# Patient Record
Sex: Male | Born: 1955 | Race: White | Hispanic: No | Marital: Married | Smoking: Never smoker
Health system: Southern US, Community
[De-identification: ages and names within clinical notes are randomized; demographics above are authoritative.]

## PROBLEM LIST (undated history)

## (undated) DIAGNOSIS — B029 Zoster without complications: Secondary | ICD-10-CM

## (undated) DIAGNOSIS — IMO0002 Reserved for concepts with insufficient information to code with codable children: Secondary | ICD-10-CM

## (undated) DIAGNOSIS — I4891 Unspecified atrial fibrillation: Secondary | ICD-10-CM

## (undated) HISTORY — DX: Reserved for concepts with insufficient information to code with codable children: IMO0002

## (undated) HISTORY — PX: KNEE ARTHROSCOPY: SUR90

## (undated) HISTORY — DX: Unspecified atrial fibrillation: I48.91

## (undated) HISTORY — PX: FINGER FRACTURE SURGERY: SHX638

## (undated) HISTORY — DX: Zoster without complications: B02.9

## (undated) HISTORY — PX: TONSILLECTOMY: SUR1361

---

## 1998-12-22 ENCOUNTER — Emergency Department (HOSPITAL_COMMUNITY): Admission: EM | Admit: 1998-12-22 | Discharge: 1998-12-22 | Payer: Self-pay | Admitting: Emergency Medicine

## 2010-02-20 ENCOUNTER — Emergency Department (HOSPITAL_COMMUNITY): Admission: EM | Admit: 2010-02-20 | Discharge: 2010-02-20 | Payer: Self-pay | Admitting: Emergency Medicine

## 2010-10-05 LAB — POCT I-STAT, CHEM 8
BUN: 13 mg/dL (ref 6–23)
Calcium, Ion: 1.16 mmol/L (ref 1.12–1.32)
Creatinine, Ser: 1.2 mg/dL (ref 0.4–1.5)
HCT: 45 % (ref 39.0–52.0)
Hemoglobin: 15.3 g/dL (ref 13.0–17.0)
Potassium: 4 mEq/L (ref 3.5–5.1)
TCO2: 27 mmol/L (ref 0–100)

## 2010-10-05 LAB — POCT CARDIAC MARKERS: CKMB, poc: 5.4 ng/mL (ref 1.0–8.0)

## 2012-02-23 ENCOUNTER — Emergency Department (HOSPITAL_BASED_OUTPATIENT_CLINIC_OR_DEPARTMENT_OTHER): Payer: BC Managed Care – PPO

## 2012-02-23 ENCOUNTER — Other Ambulatory Visit: Payer: Self-pay

## 2012-02-23 ENCOUNTER — Encounter (HOSPITAL_BASED_OUTPATIENT_CLINIC_OR_DEPARTMENT_OTHER): Payer: Self-pay | Admitting: *Deleted

## 2012-02-23 ENCOUNTER — Emergency Department (HOSPITAL_BASED_OUTPATIENT_CLINIC_OR_DEPARTMENT_OTHER)
Admission: EM | Admit: 2012-02-23 | Discharge: 2012-02-23 | Disposition: A | Discharge status: Home / Self Care | Payer: BC Managed Care – PPO | Attending: Emergency Medicine | Admitting: Emergency Medicine

## 2012-02-23 DIAGNOSIS — E860 Dehydration: Secondary | ICD-10-CM

## 2012-02-23 DIAGNOSIS — R000 Tachycardia, unspecified: Secondary | ICD-10-CM | POA: Insufficient documentation

## 2012-02-23 DIAGNOSIS — I4891 Unspecified atrial fibrillation: Secondary | ICD-10-CM | POA: Insufficient documentation

## 2012-02-23 DIAGNOSIS — R42 Dizziness and giddiness: Secondary | ICD-10-CM | POA: Insufficient documentation

## 2012-02-23 DIAGNOSIS — I519 Heart disease, unspecified: Secondary | ICD-10-CM | POA: Insufficient documentation

## 2012-02-23 DIAGNOSIS — Y849 Medical procedure, unspecified as the cause of abnormal reaction of the patient, or of later complication, without mention of misadventure at the time of the procedure: Secondary | ICD-10-CM | POA: Insufficient documentation

## 2012-02-23 LAB — CARDIAC PANEL(CRET KIN+CKTOT+MB+TROPI)
CK, MB: 15.8 ng/mL (ref 0.3–4.0)
Total CK: 4799 U/L — ABNORMAL HIGH (ref 7–232)
Troponin I: <0.3 ng/mL (ref <0.30)

## 2012-02-23 LAB — COMPREHENSIVE METABOLIC PANEL
ALT: 66 U/L — ABNORMAL HIGH (ref 0–53)
Albumin: 4.3 g/dL (ref 3.5–5.2)
BUN: 25 mg/dL — ABNORMAL HIGH (ref 6–23)
Calcium: 9.4 mg/dL (ref 8.4–10.5)
GFR calc non Af Amer: 60 mL/min — ABNORMAL LOW (ref >90)
Glucose, Bld: 130 mg/dL — ABNORMAL HIGH (ref 70–99)
Potassium: 3.8 mEq/L (ref 3.5–5.1)
Sodium: 140 mEq/L (ref 135–145)
Total Bilirubin: 0.4 mg/dL (ref 0.3–1.2)

## 2012-02-23 LAB — CBC WITH DIFFERENTIAL/PLATELET
Basophils Relative: 1 % (ref 0–1)
Lymphocytes Relative: 33 % (ref 12–46)
Lymphs Abs: 2.4 10*3/uL (ref 0.7–4.0)
Monocytes Relative: 8 % (ref 3–12)
Neutro Abs: 4 10*3/uL (ref 1.7–7.7)
Platelets: 203 10*3/uL (ref 150–400)

## 2012-02-23 LAB — PHOSPHORUS: Phosphorus: 3.4 mg/dL (ref 2.3–4.6)

## 2012-02-23 LAB — MAGNESIUM: Magnesium: 2.2 mg/dL (ref 1.5–2.5)

## 2012-02-23 MED ORDER — DILTIAZEM HCL 25 MG/5ML IV SOLN
INTRAVENOUS | Status: AC
  Administered 2012-02-23: 10 mg via INTRAVENOUS
  Filled 2012-02-23: qty 5

## 2012-02-23 MED ORDER — SODIUM CHLORIDE 0.9 % IV BOLUS (SEPSIS)
1000.0000 mL | Freq: Once | INTRAVENOUS | Status: AC
  Administered 2012-02-23: 1000 mL via INTRAVENOUS

## 2012-02-23 MED ORDER — METOPROLOL SUCCINATE ER 25 MG PO TB24: 25.0000 mg | ORAL_TABLET | Freq: Every day | ORAL | Status: DC

## 2012-02-23 MED ORDER — DILTIAZEM HCL 50 MG/10ML IV SOLN
10.0000 mg | Freq: Once | INTRAVENOUS | Status: AC
  Administered 2012-02-23: 10 mg via INTRAVENOUS
  Filled 2012-02-23: qty 2

## 2012-02-23 MED ORDER — METOPROLOL TARTRATE 50 MG PO TABS
ORAL_TABLET | ORAL | Status: AC
  Filled 2012-02-23: qty 1

## 2012-02-23 MED ORDER — METOPROLOL SUCCINATE ER 25 MG PO TB24
25.0000 mg | ORAL_TABLET | Freq: Every day | ORAL | Status: DC
  Administered 2012-02-23: 25 mg via ORAL
  Filled 2012-02-23: qty 1

## 2012-02-23 MED ORDER — METOPROLOL TARTRATE 1 MG/ML IV SOLN
2.5000 mg | Freq: Once | INTRAVENOUS | Status: DC
  Filled 2012-02-23: qty 5

## 2012-02-23 MED ORDER — PROPOFOL 10 MG/ML IV BOLUS
0.5000 mg/kg | Freq: Once | INTRAVENOUS | Status: AC
  Administered 2012-02-23: 80 mg via INTRAVENOUS
  Filled 2012-02-23 (×2): qty 20

## 2012-02-23 MED ORDER — PROPOFOL 10 MG/ML IV BOLUS
INTRAVENOUS | Status: AC | PRN
  Administered 2012-02-23: 10 mg via INTRAVENOUS
  Administered 2012-02-23: 50 mg via INTRAVENOUS

## 2012-02-23 NOTE — ED Notes (Signed)
Family updated as to patient's status. Wife at bedside 

## 2012-02-23 NOTE — ED Provider Notes (Addendum)
History     CSN: [id number]  Arrival date & time 02/23/12  0211   First MD Initiated Contact with Patient 02/23/12 0221      Chief Complaint  Patient presents with  . Palpitations    (Consider location/radiation/quality/duration/timing/severity/associated sxs/prior treatment) Patient is a 56 y.o. male presenting with palpitations. The history is provided by the patient.  Palpitations  This is a new problem. The current episode started 1 to 2 hours ago. The problem occurs constantly. The problem has not changed since onset.Associated with: states always under a lot of stress but o/w was just eating icecream when it started. On average, each episode lasts 1 hour. Associated symptoms include irregular heartbeat and dizziness. Pertinent negatives include no diaphoresis, no fever, no chest pain, no chest pressure, no exertional chest pressure, no near-syncope, no cough and no shortness of breath. Associated symptoms comments: Lightheaded with standing. He has tried nothing for the symptoms. The treatment provided no relief. Risk factors include no known risk factors. His past medical history does not include anemia, heart disease or hyperthyroidism.    No past medical history on file.  No past surgical history on file.  No family history on file.  History  Substance Use Topics  . Smoking status: Not on file  . Smokeless tobacco: Not on file  . Alcohol Use: Not on file      Review of Systems  Constitutional: Negative for fever and diaphoresis.  Respiratory: Negative for cough and shortness of breath.   Cardiovascular: Positive for palpitations. Negative for chest pain and near-syncope.  Neurological: Positive for dizziness.  All other systems reviewed and are negative.    Allergies  Review of patient's allergies indicates not on file.  Home Medications  No current outpatient prescriptions on file.  BP 119/92  Pulse 140  Temp 97.6 F (36.4 C) (Oral)  Resp 20  Ht 5\' 10"   (1.778 m)  Wt 230 lb (104.327 kg)  BMI 33.00 kg/m2  SpO2 98%  Physical Exam  Nursing note and vitals reviewed. Constitutional: He is oriented to person, place, and time. He appears well-developed and well-nourished. No distress.  HENT:  Head: Normocephalic and atraumatic.  Mouth/Throat: Oropharynx is clear and moist.  Eyes: Conjunctivae and EOM are normal. Pupils are equal, round, and reactive to light.  Neck: Normal range of motion. Neck supple.  Cardiovascular: Intact distal pulses.  An irregularly irregular rhythm present. Tachycardia present.   No murmur heard. Pulmonary/Chest: Effort normal and breath sounds normal. No respiratory distress. He has no wheezes. He has no rales.  Abdominal: Soft. He exhibits no distension. There is no tenderness. There is no rebound and no guarding.  Musculoskeletal: Normal range of motion. He exhibits no edema and no tenderness.  Neurological: He is alert and oriented to person, place, and time.  Skin: Skin is warm and dry. No rash noted. No erythema.  Psychiatric: He has a normal mood and affect. His behavior is normal.    ED Course  CARDIOVERSION Date/Time: 02/23/2012 5:11 AM Performed by: Gwyneth Sprout Authorized by: Gwyneth Sprout Consent: Verbal consent obtained. Written consent obtained. Risks and benefits: risks, benefits and alternatives were discussed Consent given by: patient Patient understanding: patient states understanding of the procedure being performed Patient consent: the patient's understanding of the procedure matches consent given Relevant documents: relevant documents present and verified Patient identity confirmed: verbally with patient, arm band and hospital-assigned identification number Patient sedated: yes Sedation type: moderate (conscious) sedation Sedatives: propofol Vitals: Vital signs were  monitored during sedation. Cardioversion basis: elective Indications: failure of anti-arrhythmic  medications Pre-procedure rhythm: atrial fibrillation Patient position: patient was placed in a supine position Chest area: chest area exposed Electrodes: pads Electrodes placed: anterior-posterior Number of attempts: 2 Attempt 1 mode: synchronous Attempt 1 waveform: biphasic Attempt 1 shock (in Joules): 150 Attempt 1 outcome: no change in rhythm Attempt 2 mode: synchronous Attempt 2 waveform: biphasic Attempt 2 shock (in Joules): 200 Attempt 2 outcome: no change in rhythm Post-procedure rhythm: atrial fibrillation Complications: no complications Patient tolerance: Patient tolerated the procedure well with no immediate complications. Comments: Unable to shock out of a.fib   (including critical care time)  Labs Reviewed  COMPREHENSIVE METABOLIC PANEL - Abnormal; Notable for the following:    Glucose, Bld 130 (*)     BUN 25 (*)     AST 94 (*)     ALT 66 (*)     GFR calc non Af Amer 60 (*)     GFR calc Af Amer 70 (*)     All other components within normal limits  CARDIAC PANEL(CRET KIN+CKTOT+MB+TROPI) - Abnormal; Notable for the following:    Total CK 4799 (*)     CK, MB 15.8 (*)     All other components within normal limits  CBC WITH DIFFERENTIAL  MAGNESIUM  PHOSPHORUS   Dg Chest 2 View  02/23/2012  *RADIOLOGY REPORT*  Clinical Data: Lightheaded and atrial fibrillation.  CHEST - 2 VIEW  Comparison: 10/01/2011  Findings: Borderline heart size with normal pulmonary vascularity. No focal airspace consolidation in the lungs.  No blunting of costophrenic angles.  No pneumothorax.  Tortuous aorta. Degenerative changes in the spine.  No significant change since previous study.  IMPRESSION: No evidence of active pulmonary disease.  Original Report Authenticated By: Marlon Pel, M.D.     Date: 02/23/2012  Rate: 116  Rhythm: atrial fibrillation with RVR  QRS Axis: left  Intervals: normal  ST/T Wave abnormalities: normal  Conduction Disutrbances:none  Narrative  Interpretation:   Old EKG Reviewed: none available  CRITICAL CARE Performed by: Gwyneth Sprout   Total critical care time: 30  Critical care time was exclusive of separately billable procedures and treating other patients.  Critical care was necessary to treat or prevent imminent or life-threatening deterioration.  Critical care was time spent personally by me on the following activities: development of treatment plan with patient and/or surrogate as well as nursing, discussions with consultants, evaluation of patient's response to treatment, examination of patient, obtaining history from patient or surrogate, ordering and performing treatments and interventions, ordering and review of laboratory studies, ordering and review of radiographic studies, pulse oximetry and re-evaluation of patient's condition.   1. Atrial fibrillation   2. Dehydration       MDM   Pt with new onset a.fib starting tonight at 1am (1.5 hours ago)  Denies CP but states lightheaded with walking but no SOB.  No cardiac hx and neg stress last year.  No medical problems but states has lost 10lbs in the last 3 days from a no-carb diet.  Ate carbs today.  Pt in no distress and rate improved from 140 to 100's but still in a.fib.  Will give a dose of cardizem and will check CBC, CMP, and CE.  CXR pending.  Discussed with pt cardioversion.  3:51 AM Labs wnl other than elevated CK of 5,000 and CK-MB.  Neg troponin and normal electrolytes.  Pt's CK elevation is most likely due to his recent weight  loss and diet.  pt given a second liter of fluid.    IV cardizem controlled heart rate but did not return to sinus.  4:09 AM Pt consented for cardioversion.  Will sedate with propofol.    5:11 AM Despite 2 attempts 1 at 150J and second at 200J pt remained in a.fib.  Will discuss with cards. 5:45 AM Cards feels pt needs admission to hospitalist for further work up of CK of 5000 and they will consult.  Also they recommended  lopressor.  Gwyneth Sprout, MD  Pt spontaneosly converted to SR.  Repeat EKG wnl.  Spoke with Dr. Mayford Knife and recommended pt start on toprol 25 and f/u tomorrow.  Pt is ambulating without difficulty and otherwise no complaints.   Date: 02/23/2012  Rate: 70  Rhythm: normal sinus rhythm  QRS Axis: normal  Intervals: normal  ST/T Wave abnormalities: normal  Conduction Disutrbances: none  Narrative Interpretation: unremarkable     02/23/12 0513  Gwyneth Sprout, MD 02/23/12 1610  Gwyneth Sprout, MD 02/23/12 9604  Gwyneth Sprout, MD 02/23/12 [phone]

## 2012-02-23 NOTE — ED Notes (Signed)
MD at bedside.to discuss follow up care.  Pt OK for discharge home

## 2012-02-23 NOTE — ED Notes (Signed)
Pt presents to ED today "heart racing" and feeling lightheaded.  Pt has no signif hx.

## 2012-02-23 NOTE — ED Notes (Signed)
I placed a call for consult to the cardiologist on call per Dr. Anitra Lauth, Dr. Mayford Knife returned the call.

## 2012-02-24 ENCOUNTER — Encounter: Payer: Self-pay | Admitting: Cardiology

## 2012-02-24 ENCOUNTER — Ambulatory Visit (INDEPENDENT_AMBULATORY_CARE_PROVIDER_SITE_OTHER): Payer: BC Managed Care – PPO | Admitting: Cardiology

## 2012-02-24 ENCOUNTER — Telehealth: Payer: Self-pay | Admitting: Cardiology

## 2012-02-24 VITALS — BP 128/88 | HR 76 | Ht 71.0 in | Wt 244.0 lb

## 2012-02-24 DIAGNOSIS — I4891 Unspecified atrial fibrillation: Secondary | ICD-10-CM

## 2012-02-24 DIAGNOSIS — IMO0002 Reserved for concepts with insufficient information to code with codable children: Secondary | ICD-10-CM

## 2012-02-24 DIAGNOSIS — I4819 Other persistent atrial fibrillation: Secondary | ICD-10-CM | POA: Insufficient documentation

## 2012-02-24 DIAGNOSIS — I480 Paroxysmal atrial fibrillation: Secondary | ICD-10-CM

## 2012-02-24 DIAGNOSIS — R799 Abnormal finding of blood chemistry, unspecified: Secondary | ICD-10-CM

## 2012-02-24 HISTORY — DX: Reserved for concepts with insufficient information to code with codable children: IMO0002

## 2012-02-24 LAB — CK TOTAL AND CKMB (NOT AT ARMC)
CK, MB: 12.3 ng/mL — ABNORMAL HIGH (ref 0.3–4.0)
Relative Index: 0.9 (ref 0.0–2.5)

## 2012-02-24 NOTE — Telephone Encounter (Signed)
Scheduled appointment to be seen today.

## 2012-02-24 NOTE — Telephone Encounter (Signed)
Left message to call back  

## 2012-02-24 NOTE — Telephone Encounter (Signed)
F/U     Wife calling back to check on the status to see if her husband will be seen today or not

## 2012-02-24 NOTE — Progress Notes (Signed)
Cesar Wheeler Date of Birth:  09-08-1955 [facility] [address] [place], [place]  [address] [phone]         Fax   [phone]  History of Present Illness: This pleasant 56 year old gentleman is seen for the first time today.  He is a medical patient of Dr. Selena Batten.  He was in good health until this past weekend.  He became dehydrated wall doing yard work on Saturday.  He had also been on a crash weight loss program which involved not drinking water and avoiding all carbohydrates.  At about 1 AM on Sunday morning August 1 will eating some ice cream he felt sudden fluttering of his heart associated with a sensation of indigestion and palpitations.  He called his wife who took him to [facility] where he was found to be in atrial fibrillation with a rapid ventricular response.  Initially he did not respond to intravenous diltiazem and so he was cardioverted by the emergency room staff twice unsuccessfully.  He remained in atrial fibrillation and preparations were being made to transfer him to: When at about 6:30 in the morning he converted to normal sinus rhythm and the transfer was canceled.  Of note on that emergency room visit was a very high total CK of 4700 with slightly elevated CK-MB but normal troponins.  No thyroid tests were done.  His chemistries showed elevated liver function studies and evidence of mild dehydration with slightly elevated BUN and creatinine.  He was given a prescription for Toprol-XL 25 mg one daily and took it yesterday before leaving the emergency room but has not taken any today.  He has also been on a baby aspirin daily.  The patient does not drink any alcohol but does consume large quantities of diet drinks containing caffeine. Of note is that the patient does not have any history of ischemic heart disease.  He states that he had a stress test at [facility] about a year ago which was normal. Current Outpatient Prescriptions    Medication Sig Dispense Refill  . aspirin 81 MG tablet Take 81 mg by mouth daily.      . Glucosamine-Chondroitin (GLUCOSAMINE CHONDR COMPLEX PO) Take by mouth.      . metoprolol succinate (TOPROL-XL) 25 MG 24 hr tablet Take 1 tablet (25 mg total) by mouth daily.  10 tablet  0  . Multiple Vitamin (MULTIVITAMIN) capsule Take 1 capsule by mouth daily.        Allergies  Allergen Reactions  . Penicillins     Patient Active Problem List  Diagnosis  . PAF (paroxysmal atrial fibrillation)  . Abnormal blood test    History  Smoking status  . Never Smoker   Smokeless tobacco  . Not on file    History  Alcohol Use: Not on file    No family history on file.  Review of Systems: Constitutional: no fever chills diaphoresis or fatigue or change in weight.  Head and neck: no hearing loss, no epistaxis, no photophobia or visual disturbance. Respiratory: No cough, shortness of breath or wheezing. Cardiovascular: No chest pain peripheral edema, palpitations. Gastrointestinal: No abdominal distention, no abdominal pain, no change in bowel habits hematochezia or melena. Genitourinary: No dysuria, no frequency, no urgency, no nocturia. Musculoskeletal:No arthralgias, no back pain, no gait disturbance or myalgias. Neurological: No dizziness, no headaches, no numbness, no seizures, no syncope, no weakness, no tremors. Hematologic: No lymphadenopathy, no easy bruising. Psychiatric: No confusion, no hallucinations, no  sleep disturbance.    Physical Exam: Filed Vitals:   02/24/12 1159  BP: 128/88  Pulse: 76   the general appearance reveals a well-developed well-nourished gentleman in no distress.Pupils equal and reactive.   Extraocular Movements are full.  There is no scleral icterus.  The mouth and pharynx are normal.  The neck is supple.  The carotids reveal no bruits.  The jugular venous pressure is normal.  The thyroid is not enlarged.  There is no lymphadenopathy.  The chest is clear  to percussion and auscultation. There are no rales or rhonchi. Expansion of the chest is symmetrical.  The precordium is quiet.  The first heart sound is normal.  The second heart sound is physiologically split.  There is no murmur gallop rub or click.  There is no abnormal lift or heave.  The abdomen is soft and nontender. Bowel sounds are normal. The liver and spleen are not enlarged. There Are no abdominal masses. There are no bruits.  The pedal pulses are good.  There is no phlebitis or edema.  There is no cyanosis or clubbing. Strength is normal and symmetrical in all extremities.  There is no lateralizing weakness.  There are no sensory deficits.  The skin is warm and dry.  There is no rash.  EKG today shows normal sinus rhythm and is within normal limits.   Assessment / Plan: 1.  Paroxysmal atrial fibrillation, resolved 2.  mild exogenous obesity 3.  recent marked elevation of total CK levels probably secondary to dehydration and mild rhabdomyolysis.  Plan: At this point the patient is asymptomatic and is back in normal sinus rhythm.  We will have him continue his Toprol XL 25 one daily and continue his baby aspirin 81 mg daily.  He has a chadds score of 0. We will have him return for a two-dimensional echocardiogram.  Today we are checking thyroid function as well as repeating his CK and CK-MB and repeating his electrolytes and his liver function studies.  He has been advised to avoid caffeine.  He does not drink alcohol. We will plan to have him return in about one month for followup office visit and EKG  And we will call him with the results of his blood work and his echocardiogram results.  The patient was encouraged to get regular moderate aerobic exercise and to lose weight.

## 2012-02-24 NOTE — Patient Instructions (Addendum)
Your physician has requested that you have an echocardiogram. Echocardiography is a painless test that uses sound waves to create images of your heart. It provides your doctor with information about the size and shape of your heart and how well your heart's chambers and valves are working. This procedure takes approximately one hour. There are no restrictions for this procedure.  AVOID CAFFEINE   Start Toprol XL that was given  Will obtain labs today and call you with the results   Your physician recommends that you schedule a follow-up appointment in: 1 month ov/ekg with Lawson Fiscal NP or  Dr. Patty Sermons

## 2012-02-24 NOTE — Telephone Encounter (Signed)
New msg Pt was seen yesterday morning for afib and was told to see Dr Patty Sermons today

## 2012-02-25 ENCOUNTER — Ambulatory Visit (HOSPITAL_COMMUNITY): Payer: BC Managed Care – PPO | Attending: Cardiology

## 2012-02-25 DIAGNOSIS — I059 Rheumatic mitral valve disease, unspecified: Secondary | ICD-10-CM | POA: Insufficient documentation

## 2012-02-25 DIAGNOSIS — I480 Paroxysmal atrial fibrillation: Secondary | ICD-10-CM

## 2012-02-25 DIAGNOSIS — R002 Palpitations: Secondary | ICD-10-CM | POA: Insufficient documentation

## 2012-02-25 DIAGNOSIS — E669 Obesity, unspecified: Secondary | ICD-10-CM | POA: Insufficient documentation

## 2012-02-25 DIAGNOSIS — I4891 Unspecified atrial fibrillation: Secondary | ICD-10-CM

## 2012-02-25 LAB — HEPATIC FUNCTION PANEL
Alkaline Phosphatase: 59 U/L (ref 39–117)
Bilirubin, Direct: 0.1 mg/dL (ref 0.0–0.3)
Total Bilirubin: 0.6 mg/dL (ref 0.3–1.2)

## 2012-02-25 LAB — BASIC METABOLIC PANEL
BUN: 13 mg/dL (ref 6–23)
CO2: 24 mEq/L (ref 19–32)
Chloride: 106 mEq/L (ref 96–112)
Creatinine, Ser: 1.1 mg/dL (ref 0.4–1.5)
Potassium: 4.1 mEq/L (ref 3.5–5.1)

## 2012-02-25 NOTE — Progress Notes (Signed)
Echocardiogram performed.  

## 2012-02-26 ENCOUNTER — Other Ambulatory Visit: Payer: Self-pay | Admitting: *Deleted

## 2012-02-26 ENCOUNTER — Other Ambulatory Visit (INDEPENDENT_AMBULATORY_CARE_PROVIDER_SITE_OTHER): Payer: BC Managed Care – PPO

## 2012-02-26 ENCOUNTER — Telehealth: Payer: Self-pay | Admitting: Cardiology

## 2012-02-26 DIAGNOSIS — R799 Abnormal finding of blood chemistry, unspecified: Secondary | ICD-10-CM

## 2012-02-26 DIAGNOSIS — IMO0002 Reserved for concepts with insufficient information to code with codable children: Secondary | ICD-10-CM

## 2012-02-26 DIAGNOSIS — I4891 Unspecified atrial fibrillation: Secondary | ICD-10-CM

## 2012-02-26 NOTE — Telephone Encounter (Signed)
Pt rtn your call

## 2012-02-26 NOTE — Telephone Encounter (Signed)
Advised of echo.  Patient coming for labs today and is to disregard labs in system on 02/24/12

## 2012-02-26 NOTE — Telephone Encounter (Signed)
Message copied by Burnell Blanks on Wed Feb 26, 2012  4:23 PM ------      Message from: Cassell Clement      Created: Tue Feb 25, 2012  9:27 PM       Echo is normal please report

## 2012-02-27 ENCOUNTER — Telehealth: Payer: Self-pay | Admitting: Cardiology

## 2012-02-27 LAB — HEPATIC FUNCTION PANEL
AST: 36 U/L (ref 0–37)
Alkaline Phosphatase: 58 U/L (ref 39–117)
Total Bilirubin: 0.9 mg/dL (ref 0.3–1.2)

## 2012-02-27 LAB — T4, FREE: Free T4: 0.5 ng/dL — ABNORMAL LOW (ref 0.60–1.60)

## 2012-02-27 LAB — CARDIAC PANEL
CK-MB: 13 ng/mL — ABNORMAL HIGH (ref 0.3–4.0)
Relative Index: 2.3 calc (ref 0.0–2.5)

## 2012-02-27 LAB — BASIC METABOLIC PANEL
Calcium: 9.4 mg/dL (ref 8.4–10.5)
GFR: 77.03 mL/min (ref >60.00)
Potassium: 4 mEq/L (ref 3.5–5.1)
Sodium: 138 mEq/L (ref 135–145)

## 2012-02-27 LAB — TSH: TSH: 7.49 u[IU]/mL — ABNORMAL HIGH (ref 0.35–5.50)

## 2012-02-27 NOTE — Telephone Encounter (Signed)
New msg Pt wants to know lab results from labs that were redone.

## 2012-02-27 NOTE — Telephone Encounter (Signed)
Advised patient  Dr. Patty Sermons had not reviewed labs, will call after reviewed

## 2012-02-28 ENCOUNTER — Telehealth: Payer: Self-pay | Admitting: *Deleted

## 2012-02-28 MED ORDER — METOPROLOL SUCCINATE ER 25 MG PO TB24: 25.0000 mg | ORAL_TABLET | Freq: Every day | ORAL | Status: DC

## 2012-02-28 NOTE — Telephone Encounter (Signed)
Advised of labs 

## 2012-02-28 NOTE — Telephone Encounter (Signed)
Message copied by Burnell Blanks on Fri Feb 28, 2012  4:31 PM ------      Message from: Cassell Clement      Created: Thu Feb 27, 2012  8:53 PM       Repeat labs show further improvement in CK level. The thyroid tests now show slight hypothyroid status.I would like him to see his PCP Dr. Selena Batten about treatment of his thyroid.

## 2012-03-24 ENCOUNTER — Ambulatory Visit (INDEPENDENT_AMBULATORY_CARE_PROVIDER_SITE_OTHER): Payer: BC Managed Care – PPO | Admitting: Cardiology

## 2012-03-24 ENCOUNTER — Encounter: Payer: Self-pay | Admitting: Cardiology

## 2012-03-24 VITALS — BP 113/76 | HR 72 | Ht 71.0 in | Wt 238.8 lb

## 2012-03-24 DIAGNOSIS — I4891 Unspecified atrial fibrillation: Secondary | ICD-10-CM

## 2012-03-24 DIAGNOSIS — I480 Paroxysmal atrial fibrillation: Secondary | ICD-10-CM

## 2012-03-24 DIAGNOSIS — IMO0002 Reserved for concepts with insufficient information to code with codable children: Secondary | ICD-10-CM

## 2012-03-24 DIAGNOSIS — R799 Abnormal finding of blood chemistry, unspecified: Secondary | ICD-10-CM

## 2012-03-24 NOTE — Assessment & Plan Note (Signed)
The patient was recently found to have a slightly elevated TSH level and his primary care physician has put him on a low dose of Synthroid 50 mcg daily.

## 2012-03-24 NOTE — Progress Notes (Signed)
Cesar Wheeler Date of Birth:  [birth year] [facility] [address] [place], [place]  [address] [phone]  Fax   [phone]  HPI: This pleasant 56 year old gentleman is seen for a one-month followup office visit. He is a medical patient of Dr. Selena Batten. He was in good health until about one month ago. He became dehydrated wall doing yard work on Saturday. He had also been on a crash weight loss program which involved not drinking water and avoiding all carbohydrates. At about 1 AM on Sunday morning August 1 he was eating some ice cream he felt sudden fluttering of his heart associated with a sensation of indigestion and palpitations. He called his wife who took him to [facility] where he was found to be in atrial fibrillation with a rapid ventricular response. Initially he did not respond to intravenous diltiazem and so he was cardioverted by the emergency room staff twice unsuccessfully. He remained in atrial fibrillation and preparations were being made to transfer him to: When at about 6:30 in the morning he converted to normal sinus rhythm and the transfer was canceled. Of note on that emergency room visit was a very high total CK of 4700 with slightly elevated CK-MB but normal troponins. No thyroid tests were done. His chemistries showed elevated liver function studies and evidence of mild dehydration with slightly elevated BUN and creatinine. He was given a prescription for Toprol-XL 25 mg one daily. He has also been on a baby aspirin daily. The patient does not drink any alcohol but previously consumed large quantities of diet drinks containing caffeine.  Of note is that the patient does not have any history of ischemic heart disease. He states that he had a stress test at [facility] about a year ago which was normal.   Current Outpatient Prescriptions  Medication Sig Dispense Refill  . aspirin 81 MG tablet Take 81 mg by mouth daily.      .  Glucosamine-Chondroitin (GLUCOSAMINE CHONDR COMPLEX PO) Take by mouth.      . levothyroxine (SYNTHROID, LEVOTHROID) 50 MCG tablet Take 50 mcg by mouth daily.      . metoprolol succinate (TOPROL-XL) 25 MG 24 hr tablet Take 25 mg by mouth as directed. 1/2 tablet daily      . Multiple Vitamin (MULTIVITAMIN) capsule Take 1 capsule by mouth daily.      Marland Kitchen DISCONTD: metoprolol succinate (TOPROL-XL) 25 MG 24 hr tablet Take 1 tablet (25 mg total) by mouth daily.  30 tablet  3    Allergies  Allergen Reactions  . Penicillins     Patient Active Problem List  Diagnosis  . PAF (paroxysmal atrial fibrillation)  . Abnormal blood test    History  Smoking status  . Never Smoker   Smokeless tobacco  . Not on file    History  Alcohol Use: Not on file    No family history on file.  Review of Systems: The patient denies any heat or cold intolerance.  No weight gain or weight loss.  The patient denies headaches or blurry vision.  There is no cough or sputum production.  The patient denies dizziness.  There is no hematuria or hematochezia.  The patient denies any muscle aches or arthritis.  The patient denies any rash.  The patient denies frequent falling or instability.  There is no history of depression or anxiety.  All other systems were reviewed and are negative.   Physical Exam: Filed Vitals:   03/24/12  1537  BP: 113/76  Pulse: 72   the general appearance reveals a well-developed well-nourished no distress.The head and neck exam reveals pupils equal and reactive.  Extraocular movements are full.  There is no scleral icterus.  The mouth and pharynx are normal.  The neck is supple.  The carotids reveal no bruits.  The jugular venous pressure is normal.  The  thyroid is not enlarged.  There is no lymphadenopathy.  The chest is clear to percussion and auscultation.  There are no rales or rhonchi.  Expansion of the chest is symmetrical.  The precordium is quiet.  The first heart sound is normal.  The  second heart sound is physiologically split.  There is no murmur gallop rub or click.  There is no abnormal lift or heave.  The abdomen is soft and nontender.  The bowel sounds are normal.  The liver and spleen are not enlarged.  There are no abdominal masses.  There are no abdominal bruits.  Extremities reveal good pedal pulses.  There is no phlebitis or edema.  There is no cyanosis or clubbing.  Strength is normal and symmetrical in all extremities.  There is no lateralizing weakness.  There are no sensory deficits.  The skin is warm and dry.  There is no rash.  EKG today shows sinus rhythm at 72 per minute and no ischemic changes    Assessment / Plan: We are going to decrease his Toprol-XL to just 12.5 mg daily.  We will plan to see him in another 4 months and repeat his EKG then.  He has no further problems with his atrial fib we will consider stopping his Toprol altogether at that point.  He feels that a lot of his previous episode of atrial fib was related to dehydration and excessive caffeine.

## 2012-03-24 NOTE — Assessment & Plan Note (Signed)
Since last visit the patient has had no recurrence of his paroxysmal atrial fibrillation.  He had an echocardiogram on 02/25/12 which showed an ejection fraction of 55-65% and was normal. Since last visit the patient has avoided caffeine.  Complains of lack of energy and attributes that to lack of caffeine and also the effects of Toprol

## 2012-03-24 NOTE — Patient Instructions (Addendum)
Decrease your Toprol 25 mg to 1/2 tablet daily  Your physician wants you to follow-up in: 4 months You will receive a reminder letter in the mail two months in advance. If you don't receive a letter, please call our office to schedule the follow-up appointment.

## 2012-04-20 ENCOUNTER — Other Ambulatory Visit: Payer: Self-pay | Admitting: Family Medicine

## 2012-04-20 DIAGNOSIS — E049 Nontoxic goiter, unspecified: Secondary | ICD-10-CM

## 2012-04-21 ENCOUNTER — Ambulatory Visit
Admission: RE | Admit: 2012-04-21 | Discharge: 2012-04-21 | Disposition: A | Discharge status: Home / Self Care | Payer: BC Managed Care – PPO | Source: Ambulatory Visit | Attending: Family Medicine | Admitting: Family Medicine

## 2012-04-21 DIAGNOSIS — E049 Nontoxic goiter, unspecified: Secondary | ICD-10-CM

## 2012-06-02 ENCOUNTER — Encounter: Payer: Self-pay | Admitting: Cardiology

## 2012-06-02 ENCOUNTER — Ambulatory Visit (INDEPENDENT_AMBULATORY_CARE_PROVIDER_SITE_OTHER): Payer: BC Managed Care – PPO | Admitting: Cardiology

## 2012-06-02 VITALS — BP 124/86 | HR 76 | Ht 71.0 in | Wt 237.0 lb

## 2012-06-02 DIAGNOSIS — I4891 Unspecified atrial fibrillation: Secondary | ICD-10-CM

## 2012-06-02 DIAGNOSIS — I480 Paroxysmal atrial fibrillation: Secondary | ICD-10-CM

## 2012-06-02 MED ORDER — DILTIAZEM HCL ER COATED BEADS 300 MG PO CP24: ORAL_CAPSULE | ORAL | Status: DC

## 2012-06-02 NOTE — Progress Notes (Signed)
Cesar Wheeler Date of Birth:  1956-04-12 [facility] [address] [place], [place]  [address] [phone]  Fax   [phone]  HPI: This pleasant 56 year old gentleman is seen as a work in the office visit.  He has a past history of paroxysmal atrial fibrillation.  He does not have any history of congestive heart failure, diabetes, high blood pressure, or previous stroke.  His Chadss score is 0.  He is on daily aspirin his last episode of atrial fibrillation was in August 2013. Of note is that the patient does not have any history of ischemic heart disease. He states that he had a stress test at [facility] about a year ago which was normal.   Current Outpatient Prescriptions  Medication Sig Dispense Refill  . aspirin 81 MG tablet Take 81 mg by mouth daily.      . Glucosamine-Chondroitin (GLUCOSAMINE CHONDR COMPLEX PO) Take by mouth.      . levothyroxine (SYNTHROID, LEVOTHROID) 25 MCG tablet Take 25 mcg by mouth daily.      . metoprolol succinate (TOPROL-XL) 25 MG 24 hr tablet Take by mouth. 1/2 tablet daily      . Multiple Vitamin (MULTIVITAMIN) capsule Take 1 capsule by mouth daily.      Marland Kitchen diltiazem (CARDIZEM CD) 300 MG 24 hr capsule Take 1 tablet as needed  4 capsule  5    Allergies  Allergen Reactions  . Penicillins     Patient Active Problem List  Diagnosis  . PAF (paroxysmal atrial fibrillation)  . Abnormal blood test    History  Smoking status  . Never Smoker   Smokeless tobacco  . Not on file    History  Alcohol Use: Not on file    No family history on file.  Review of Systems: The patient denies any heat or cold intolerance.  No weight gain or weight loss.  The patient denies headaches or blurry vision.  There is no cough or sputum production.  The patient denies dizziness.  There is no hematuria or hematochezia.  The patient denies any muscle aches or arthritis.  The patient denies any rash.  The patient denies frequent falling or  instability.  There is no history of depression or anxiety.  All other systems were reviewed and are negative.   Physical Exam: Filed Vitals:   06/02/12 1102  BP: 124/86  Pulse: 76   the general appearance reveals a well-developed well-nourished gentleman in no distress.The head and neck exam reveals pupils equal and reactive.  Extraocular movements are full.  There is no scleral icterus.  The mouth and pharynx are normal.  The neck is supple.  The carotids reveal no bruits.  The jugular venous pressure is normal.  The  thyroid is not enlarged.  There is no lymphadenopathy.  The chest is clear to percussion and auscultation.  There are no rales or rhonchi.  Expansion of the chest is symmetrical.  The precordium is quiet.  The first heart sound is normal.  The second heart sound is physiologically split.  There is no murmur gallop rub or click.  There is no abnormal lift or heave.  The abdomen is soft and nontender.  The bowel sounds are normal.  The liver and spleen are not enlarged.  There are no abdominal masses.  There are no abdominal bruits.  Extremities reveal good pedal pulses.  There is no phlebitis or edema.  There is no cyanosis or clubbing.  Strength is normal and symmetrical  in all extremities.  There is no lateralizing weakness.  There are no sensory deficits.  The skin is warm and dry.  There is no rash.  EKG shows normal sinus rhythm and is within normal limits.    Assessment / Plan: The patient is to continue taking a 81 mg aspirin daily and he should stay on Toprol-XL 25 mg daily.  We also gave him a prescription for Cardizem CD 300 mg to be used as a pill in the pocket. The patient is advised to continue to limit caffeine and to get regular aerobic exercise. Recheck here in 6 months for followup office visit and EKG

## 2012-06-02 NOTE — Assessment & Plan Note (Signed)
The patient had another episode of paroxysmal atrial fibrillation while driving to the beach last Friday night.  He had had a stressful cell phone conversation just prior to the onset of his arrhythmia.  Prior to that he had drunk a diet coke.  [place] he [place] the beach he went to the local emergency room where he was admitted for observation overnight.  He converted on IV Cardizem at about 5 AM and was discharged later that day.  He was advised by the hospital to increase his beta blocker up to a whole 25 mg tablet daily.  There also cut back his Synthroid dose from 50 mcg down to 25 mcg daily apparently as a result of blood work obtained

## 2012-06-02 NOTE — Patient Instructions (Signed)
Will Rx Cardizem CD 300 mg to take at onset of Afib, Rx sent to [facility]  Your physician wants you to follow-up in: 6 months ov/ekg You will receive a reminder letter in the mail two months in advance. If you don't receive a letter, please call our office to schedule the follow-up appointment.

## 2012-06-08 ENCOUNTER — Telehealth: Payer: Self-pay | Admitting: Cardiology

## 2012-06-08 NOTE — Telephone Encounter (Signed)
Pt had a-fib last night and he is concerned and he took the medication and he wants to make sure he doesn't need to come in

## 2012-06-08 NOTE — Telephone Encounter (Signed)
Patient called earlier with episode of Afib yesterday for about 7 hours.  Patient had drank a carbonated drink (sprite) and he felt this could have contributed to episode. Patient took his Cardizem but it was several hours before he converted. Discussed with  Dr. Patty Sermons and will have the patient increase his Metoprolol 25 mg to twice a day and cont with Cardizem as needed.  Advised patient if he continues to have episodes to call back.  Patient verbalized understanding.

## 2012-06-08 NOTE — Telephone Encounter (Signed)
Left pt a message to call back. 

## 2012-06-15 ENCOUNTER — Telehealth: Payer: Self-pay | Admitting: Cardiology

## 2012-06-15 NOTE — Telephone Encounter (Signed)
Pt rtn call [phone]

## 2012-06-15 NOTE — Telephone Encounter (Signed)
Left message to call back  

## 2012-06-15 NOTE — Telephone Encounter (Signed)
Another episode of Afib over weekend, will see for ov tomorrow

## 2012-06-15 NOTE — Telephone Encounter (Signed)
Pt calling re afib episodes, has colonoscopy consult tomorrow, should he cxl

## 2012-06-16 ENCOUNTER — Ambulatory Visit (INDEPENDENT_AMBULATORY_CARE_PROVIDER_SITE_OTHER): Payer: BC Managed Care – PPO | Admitting: Cardiology

## 2012-06-16 ENCOUNTER — Encounter: Payer: Self-pay | Admitting: Cardiology

## 2012-06-16 VITALS — BP 132/70 | HR 82 | Resp 18 | Ht 71.0 in | Wt 239.0 lb

## 2012-06-16 DIAGNOSIS — I4891 Unspecified atrial fibrillation: Secondary | ICD-10-CM

## 2012-06-16 DIAGNOSIS — I480 Paroxysmal atrial fibrillation: Secondary | ICD-10-CM

## 2012-06-16 MED ORDER — DILTIAZEM HCL ER COATED BEADS 300 MG PO CP24: 300.0000 mg | ORAL_CAPSULE | Freq: Every day | ORAL | Status: DC

## 2012-06-16 NOTE — Patient Instructions (Signed)
STOP METOPROLOL AND START CARDIZEM CD 300 MG DAILY, RX SENT TO [facility]  Your physician recommends that you schedule a follow-up appointment in: 2 month ov/ekg

## 2012-06-16 NOTE — Assessment & Plan Note (Signed)
The patient is having more frequent episodes of paroxysmal atrial fibrillation.  One of the episodes occurred during sleep and he awoke in atrial fibrillation last weekend.  Took Cardizem 300 mg and converted within 3 hours. In an effort to decrease the frequency of his paroxysmal atrial fibrillation we are going to put him on daily Cardizem 300 CD one daily.  If he breaks through with atrial fib and a rapid ventricular response he can take a second tablet of Cardizem.  We will stop his metoprolol at this point since it does not appear to have been effective in a dose of Toprol 25 mg twice a day.

## 2012-06-16 NOTE — Progress Notes (Signed)
Cesar Wheeler Date of Birth:  1956/02/04 [facility] [address] [place], [place]  [address] [phone]         Fax   [phone]  History of Present Illness: This pleasant 56 year old gentleman is seen for a followup office visit. He has a past history of paroxysmal atrial fibrillation. He does not have any history of congestive heart failure, diabetes, high blood pressure, or previous stroke. His Chadss score is 0. He is on daily aspirin.  His echocardiogram 02/25/12 showed an ejection fraction of 55-65% with mild mitral regurgitation and  his atrial size was at the upper limit of normal.  The patient had an episode of atrial fibrillation on 02/23/2012.  He has had subsequent episodes on November 8, November 17, and November 24.  He has been using Cardizem as a pill in the pocket and it usually works within a few hours.  Several of his episodes have occurred after drinking carbonated drinks, and these were drinks without caffeine. Of note is that the patient does not have any history of ischemic heart disease. He states that he had a stress test at [facility] about a year ago which was normal.   Current Outpatient Prescriptions  Medication Sig Dispense Refill  . aspirin 81 MG tablet Take 81 mg by mouth daily.      Marland Kitchen diltiazem (CARDIZEM CD) 300 MG 24 hr capsule Take 1 capsule (300 mg total) by mouth daily.  30 capsule  5  . Glucosamine-Chondroitin (GLUCOSAMINE CHONDR COMPLEX PO) Take by mouth.      . levothyroxine (SYNTHROID, LEVOTHROID) 25 MCG tablet Take 25 mcg by mouth 2 (two) times daily.       . Multiple Vitamin (MULTIVITAMIN) capsule Take 1 capsule by mouth daily.      . [DISCONTINUED] diltiazem (CARDIZEM CD) 300 MG 24 hr capsule Take 1 tablet as needed  4 capsule  5  . fluticasone (FLONASE) 50 MCG/ACT nasal spray         Allergies  Allergen Reactions  . Penicillins     Patient Active Problem List  Diagnosis  . PAF (paroxysmal atrial fibrillation)  .  Abnormal blood test    History  Smoking status  . Never Smoker   Smokeless tobacco  . Not on file    History  Alcohol Use: Not on file    No family history on file.  Review of Systems: Constitutional: no fever chills diaphoresis or fatigue or change in weight.  Head and neck: no hearing loss, no epistaxis, no photophobia or visual disturbance. Respiratory: No cough, shortness of breath or wheezing. Cardiovascular: No chest pain peripheral edema, palpitations. Gastrointestinal: No abdominal distention, no abdominal pain, no change in bowel habits hematochezia or melena. Genitourinary: No dysuria, no frequency, no urgency, no nocturia. Musculoskeletal:No arthralgias, no back pain, no gait disturbance or myalgias. Neurological: No dizziness, no headaches, no numbness, no seizures, no syncope, no weakness, no tremors. Hematologic: No lymphadenopathy, no easy bruising. Psychiatric: No confusion, no hallucinations, no sleep disturbance.    Physical Exam: Filed Vitals:   06/16/12 1412  BP: 132/70  Pulse: 82  Resp: 18   the general appearance reveals a well-developed well-nourished gentleman in no distress.The head and neck exam reveals pupils equal and reactive.  Extraocular movements are full.  There is no scleral icterus.  The mouth and pharynx are normal.  The neck is supple.  The carotids reveal no bruits.  The jugular venous pressure is normal.  The  thyroid  is not enlarged.  There is no lymphadenopathy.  The chest is clear to percussion and auscultation.  There are no rales or rhonchi.  Expansion of the chest is symmetrical.  The precordium is quiet.  The first heart sound is normal.  The second heart sound is physiologically split.  There is no murmur gallop rub or click.  There is no abnormal lift or heave.  The abdomen is soft and nontender.  The bowel sounds are normal.  The liver and spleen are not enlarged.  There are no abdominal masses.  There are no abdominal bruits.   Extremities reveal good pedal pulses.  There is no phlebitis or edema.  There is no cyanosis or clubbing.  Strength is normal and symmetrical in all extremities.  There is no lateralizing weakness.  There are no sensory deficits.  The skin is warm and dry.  There is no rash.  EKG shows normal sinus rhythm at 72 per minute and no ischemic changes.  He has left axis deviation.   Assessment / Plan: Stop metoprolol and to switch to Cardizem CD 300 one daily and observe response of frequency of his paroxysmal atrial fibrillation episodes.  Continue daily aspirin.  He is CHADSS score of 0 and it does not need Coumadin.

## 2012-06-29 ENCOUNTER — Telehealth: Payer: Self-pay | Admitting: Cardiology

## 2012-06-29 NOTE — Telephone Encounter (Signed)
New problem:   C/O Afib on Saturday am.

## 2012-06-29 NOTE — Telephone Encounter (Signed)
No need to see at this point.  Keep working on treatment for the acid reflux and avoid spicy foods etc. continue to use the extra pill in the pocket Cardizem when necessary.  Keep regular appointment unless symptoms worsen.

## 2012-06-29 NOTE — Telephone Encounter (Signed)
Had a lot of spicy food Friday night and woke up Saturday am with A Fib, took extra pill and in about 3 hours better.  Is having a problem with reflux and thinks it might be triggering it. Started taking Prilosec 20 mg 14 days ago and still had issue on Saturday am.  This was the first Afib in 2 weeks. Will forward to  Dr. Patty Sermons for Garald Balding

## 2012-06-29 NOTE — Telephone Encounter (Signed)
Advised patient

## 2012-07-07 ENCOUNTER — Encounter: Payer: Self-pay | Admitting: *Deleted

## 2012-07-07 ENCOUNTER — Telehealth: Payer: Self-pay | Admitting: Cardiology

## 2012-07-07 NOTE — Telephone Encounter (Signed)
Scheduled appointment for Thursday, A Fib every weekend now

## 2012-07-07 NOTE — Telephone Encounter (Signed)
plz return call to pt [phone] regarding more A-Fib symptoms

## 2012-07-07 NOTE — Telephone Encounter (Signed)
Does convert to NSR after about 3 hours

## 2012-07-09 ENCOUNTER — Ambulatory Visit (INDEPENDENT_AMBULATORY_CARE_PROVIDER_SITE_OTHER): Payer: BC Managed Care – PPO | Admitting: Cardiology

## 2012-07-09 ENCOUNTER — Encounter: Payer: Self-pay | Admitting: Cardiology

## 2012-07-09 VITALS — BP 102/70 | HR 65 | Ht 71.0 in | Wt 238.0 lb

## 2012-07-09 DIAGNOSIS — I4891 Unspecified atrial fibrillation: Secondary | ICD-10-CM

## 2012-07-09 DIAGNOSIS — K219 Gastro-esophageal reflux disease without esophagitis: Secondary | ICD-10-CM

## 2012-07-09 DIAGNOSIS — I480 Paroxysmal atrial fibrillation: Secondary | ICD-10-CM

## 2012-07-09 NOTE — Progress Notes (Signed)
Cesar Wheeler Date of Birth:  November 03, 1955 Cesar Wheeler [address] [place], [place]  [address] [phone]  Fax   [phone]  HPI: This pleasant 56 year old gentleman is seen for a work in office visit. He has a past history of paroxysmal atrial fibrillation. He does not have any history of congestive heart failure, diabetes, high blood pressure, or previous stroke. His Chadss score is 0. He is on daily aspirin. His echocardiogram 02/25/12 showed an ejection fraction of 55-65% with mild mitral regurgitation and his atrial size was at the upper limit of normal. The patient had an episode of atrial fibrillation on 02/23/2012. He has had subsequent episodes on November 8, November 17, and November 24.  Since we last saw him he has had 3 additional episodes, on December 7, December 15, and December 19.  He is on Cardizem CD 300 mg daily every day. He has been using additional Cardizem as a pill in the pocket and it usually works within a few hours. Several of his episodes have occurred after drinking carbonated drinks, and these were drinks without caffeine.  Of note is that the patient does not have any history of ischemic heart disease. He states that he had a stress test at [facility] about a year ago which was normal.  Of note is the fact that he had recent thyroid function tests after which she was told to stop his Synthroid immediately.  He has been off his Synthroid for a day and a half.   Current Outpatient Prescriptions  Medication Sig Dispense Refill  . aspirin 81 MG tablet Take 81 mg by mouth daily.      Marland Kitchen diltiazem (CARDIZEM CD) 300 MG 24 hr capsule Take 1 capsule (300 mg total) by mouth daily.  30 capsule  5  . fluticasone (FLONASE) 50 MCG/ACT nasal spray       . omeprazole (PRILOSEC OTC) 20 MG tablet Take 20 mg by mouth daily.        Allergies  Allergen Reactions  . Penicillins     Patient Active Problem List  Diagnosis  . PAF (paroxysmal atrial fibrillation)    . Abnormal blood test    History  Smoking status  . Never Smoker   Smokeless tobacco  . Not on file    History  Alcohol Use: Not on file    No family history on file.  Review of Systems: The patient denies any heat or cold intolerance.  No weight gain or weight loss.  The patient denies headaches or blurry vision.  There is no cough or sputum production.  The patient denies dizziness.  There is no hematuria or hematochezia.  The patient denies any muscle aches or arthritis.  The patient denies any rash.  The patient denies frequent falling or instability.  There is no history of depression or anxiety.  All other systems were reviewed and are negative.   Physical Exam: Filed Vitals:   07/09/12 1427  BP: 102/70  Pulse: 65   general appearance reveals a large gentleman in no distress.  Rhythm is regular today.The head and neck exam reveals pupils equal and reactive.  Extraocular movements are full.  There is no scleral icterus.  The mouth and pharynx are normal.  The neck is supple.  The carotids reveal no bruits.  The jugular venous pressure is normal.  The  thyroid is not enlarged.  There is no lymphadenopathy.  The chest is clear to percussion and auscultation.  There are  no rales or rhonchi.  Expansion of the chest is symmetrical.  The precordium is quiet.  The first heart sound is normal.  The second heart sound is physiologically split.  There is no murmur gallop rub or click.  There is no abnormal lift or heave.  The abdomen is soft and nontender.  The bowel sounds are normal.  The liver and spleen are not enlarged.  There are no abdominal masses.  There are no abdominal bruits.  Extremities reveal good pedal pulses.  There is no phlebitis or edema.  There is no cyanosis or clubbing.  Strength is normal and symmetrical in all extremities.  There is no lateralizing weakness.  There are no sensory deficits.  The skin is warm and dry.  There is no rash.  EKG shows normal sinus rhythm at  65 per minute with minimal voltage criteria for LVH and may be normal variant    Assessment / Plan: Continue on same medication and same regimen and observe whether symptoms improve off Synthroid.  Recheck in one month for office visit and EKG or sooner when necessary.

## 2012-07-09 NOTE — Patient Instructions (Signed)
Your physician recommends that you continue on your current medications as directed. Please refer to the Current Medication list given to you today.  Your physician recommends that you schedule a follow-up appointment in: 1 month ov/ekg

## 2012-07-09 NOTE — Assessment & Plan Note (Signed)
His episodes of atrial fibrillation had been occurring more frequently recently but they have been lasting last time.  They are responsive to To 300 mg Cardizem.  It will be interesting to see if his episodes decline now that he is no longer taking Synthroid.  If his episodes persist we will consider additional antiarrhythmic therapy such as possibly flecainide which he would need to take on a twice-daily basis.  We also talked about possible consideration for EP ablation if he becomes more symptomatic and if his episodes become more frequent and more difficult to control.

## 2012-07-09 NOTE — Assessment & Plan Note (Signed)
His initial episodes in particular appeared to be related to having recently eaten or recently drunk carbonated beverages.  He has eliminated carbonated beverages as well as limited caffeine and his episodes no longer appeared to be correlated with food or fluid intake.  He is making an effort to drink a lot of water each day.

## 2012-07-13 ENCOUNTER — Telehealth: Payer: Self-pay | Admitting: Cardiology

## 2012-07-13 DIAGNOSIS — I4891 Unspecified atrial fibrillation: Secondary | ICD-10-CM

## 2012-07-13 MED ORDER — FLECAINIDE ACETATE 50 MG PO TABS: 50.0000 mg | ORAL_TABLET | Freq: Two times a day (BID) | ORAL | Status: DC

## 2012-07-13 NOTE — Telephone Encounter (Signed)
Advised patient and scheduled ov for 12/30 with  Dr. Patty Sermons

## 2012-07-13 NOTE — Telephone Encounter (Signed)
Continue present medication and add flecainide 50 mg every 12 hours. Plan to see him back in the office by Lawson Fiscal or myself in about a week for followup office visit and EKG.  If he is still having episodes we will consider increasing dose at that time up to 100 mg every 12 hours. His previous thyroid function abnormality is probably no longer playing much of a role.

## 2012-07-13 NOTE — Telephone Encounter (Signed)
Had 5 episodes over the weekend, not lasting long.  15 min, 20 min, 1 hour (on Friday) and 24 hours later episode that lasted about 45 min and Sunday morning episode lasted about a hour, episode this am about 45 min. These episodes are not as severe.  This is the first time he has multiple episodes within short time period.  He states he doesn't feel bad he just feels it in his throat.  Patient unsure if having frequently since he just d/c his thyroid medications or just what to do. Will forward  Dr. Patty Sermons for review

## 2012-07-13 NOTE — Telephone Encounter (Signed)
PT CALLING RE A-FIB EPISODES OVER THE WEEKEND, PLS ADVISE

## 2012-07-15 ENCOUNTER — Telehealth: Payer: Self-pay | Admitting: Physician Assistant

## 2012-07-15 NOTE — Telephone Encounter (Signed)
Patient called in regarding his atrial fib. Has h/o paroxysmal afib with CHADS2=0. He was previously on Cardizem 300mg  daily and was recently placed on flecainide 12/23 at 50mg  BID by Dr. Patty Sermons due to increasing frequency of symptoms. He has done well since 12/23 with no afib, but this morning has had a recurrence with HR in the 120's about an hour and a half after taking his flecainide. BP 97/70 but he runs borderline low chronically (last BP in office 103/70 and he had been tolerating the Cardizem in the past). However, he thought he was supposed to stop his Cardizem when he started the flecainide so he has not been taking it. He otherwise feels well without any CP, SOB, dizziness, nausea - he just feels this is his normal, annoying atrial fib. Instructed patient to resume Cardizem 300mg  daily as before and to let us know if he is still having symptoms in a few hours - we may be able to try an additional dose of flecainide for conversion. If he feels worse, he knows to go to the ER. The patient verbalized understanding and gratitude. Micco Bourbeau PA-C

## 2012-07-15 NOTE — Telephone Encounter (Addendum)
Addendum: patient called at 11am, still in afib with rates in the 130's. Again, no CP, SOB, dizziness or any other symptoms except being aware of the tachypalpitations. D/w Dr. Myrtis Ser. We instructed the patient to: - take an additional 3 tablets of flecainide now (150mg  dose, equalling 200mg  total for this morning including the 50mg  he took early this AM) - tonight around 9pm, increase his base dose to 100mg  BID from here on out He reiterated my instructions back to me to make sure he got them right. Our office will call him this afternoon to check on him (ph [phone]). This information will be relayed to the oncoming PA at sign out. Mr. Brister was instructed that if he begins to feel worse/accelerating symptoms, CP, SOB or anything else that makes him uncomfortable, he is welcome to proceed to ER. He verbalized understanding and gratitude.  Blia Totman PA-C

## 2012-07-16 NOTE — Telephone Encounter (Signed)
Agree with advice given

## 2012-07-20 ENCOUNTER — Encounter: Payer: Self-pay | Admitting: Cardiology

## 2012-07-20 ENCOUNTER — Ambulatory Visit (INDEPENDENT_AMBULATORY_CARE_PROVIDER_SITE_OTHER): Payer: BC Managed Care – PPO | Admitting: Cardiology

## 2012-07-20 VITALS — BP 132/88 | HR 70 | Resp 18 | Ht 71.0 in | Wt 240.1 lb

## 2012-07-20 DIAGNOSIS — I4891 Unspecified atrial fibrillation: Secondary | ICD-10-CM

## 2012-07-20 DIAGNOSIS — K219 Gastro-esophageal reflux disease without esophagitis: Secondary | ICD-10-CM

## 2012-07-20 DIAGNOSIS — I480 Paroxysmal atrial fibrillation: Secondary | ICD-10-CM

## 2012-07-20 MED ORDER — FLECAINIDE ACETATE 100 MG PO TABS: 100.0000 mg | ORAL_TABLET | Freq: Two times a day (BID) | ORAL | Status: DC

## 2012-07-20 NOTE — Assessment & Plan Note (Signed)
His symptoms of GERD resolved he is no longer taking a PPI.

## 2012-07-20 NOTE — Patient Instructions (Addendum)
Your physician recommends that you schedule a follow-up appointment in: 3-4 weeks. Your physician recommends that you continue on your current medications as directed. Please refer to the Current Medication list given to you today. 

## 2012-07-20 NOTE — Assessment & Plan Note (Signed)
He has had no episodes of atrial fib for the past 5 days.  We will continue current medication dose of flecainide which is 100 mg every 12 hours.  His QTc interval today is normal at 435 ms.  If he has further breakthroughs we can increase his flecainide to 150 mg every 12 hours.

## 2012-07-20 NOTE — Progress Notes (Signed)
Yvone Neu Date of Birth:  10/20/1955 [facility] [address] [place], [place]  [address] [phone]  Fax   [phone]  HPI: This pleasant 56 year old gentleman is seen for a work in office visit. He has a past history of paroxysmal atrial fibrillation. He does not have any history of congestive heart failure, diabetes, high blood pressure, or previous stroke. His Chadss score is 0. He is on daily aspirin. His echocardiogram 02/25/12 showed an ejection fraction of 55-65% with mild mitral regurgitation and his atrial size was at the upper limit of normal. The patient had an episode of atrial fibrillation on 02/23/2012. He has had subsequent episodes on November 8, November 17, and November 24. Since we last saw him he has had 3 additional episodes, on December 7, December 15, and December 19. He is on Cardizem CD 300 mg daily every day. He has been using additional Cardizem as a pill in the pocket and it usually works within a few hours. Several of his episodes have occurred after drinking carbonated drinks, and these were drinks without caffeine.  Of note is that the patient does not have any history of ischemic heart disease. He states that he had a stress test at [facility] about a year ago which was normal.  After we last saw him in the office on 07/09/12 he added flecainide 50 mg twice a day at that time.  However the patient misunderstood and stopped taking his diltiazem and we started the second as a result he had a lot of episodes of atrial fibrillation between December 20 and December 25.  He called our office on December 25 and it was discovered that he had not been taking his morning Cardizem.  His Tambocor was increased to 100 mg every 12 hours and he cardioverted back to sinus rhythm later that day and has had no episodes of atrial fibrillation since December 25.  He was being careful also to take his Cardizem CD 300 mg every morning.  The patient is not consuming any  caffeinated beverages.   Current Outpatient Prescriptions  Medication Sig Dispense Refill  . aspirin 81 MG tablet Take 81 mg by mouth daily.      Marland Kitchen diltiazem (CARDIZEM CD) 300 MG 24 hr capsule Take 1 capsule (300 mg total) by mouth daily.  30 capsule  5  . flecainide (TAMBOCOR) 100 MG tablet Take 1 tablet (100 mg total) by mouth every 12 (twelve) hours.  60 tablet  5  . fluticasone (FLONASE) 50 MCG/ACT nasal spray       . omeprazole (PRILOSEC OTC) 20 MG tablet Take 20 mg by mouth daily.        Allergies  Allergen Reactions  . Penicillins     Patient Active Problem List  Diagnosis  . PAF (paroxysmal atrial fibrillation)  . GERD (gastroesophageal reflux disease)    History  Smoking status  . Never Smoker   Smokeless tobacco  . Not on file    History  Alcohol Use: Not on file    No family history on file.  Review of Systems: The patient denies any heat or cold intolerance.  No weight gain or weight loss.  The patient denies headaches or blurry vision.  There is no cough or sputum production.  The patient denies dizziness.  There is no hematuria or hematochezia.  The patient denies any muscle aches or arthritis.  The patient denies any rash.  The patient denies frequent falling or instability.  There is no history of depression or anxiety.  All other systems were reviewed and are negative.   Physical Exam: Filed Vitals:   07/20/12 1336  BP: 132/88  Pulse: 70  Resp: 18   the general appearance reveals a well-developed well-nourished slightly anxious gentleman in no distress.The head and neck exam reveals pupils equal and reactive.  Extraocular movements are full.  There is no scleral icterus.  The mouth and pharynx are normal.  The neck is supple.  The carotids reveal no bruits.  The jugular venous pressure is normal.  The  thyroid is not enlarged.  There is no lymphadenopathy.  The chest is clear to percussion and auscultation.  There are no rales or rhonchi.  Expansion of  the chest is symmetrical.  The precordium is quiet.  The first heart sound is normal.  The second heart sound is physiologically split.  There is no murmur gallop rub or click.  There is no abnormal lift or heave.  The abdomen is soft and nontender.  The bowel sounds are normal.  The liver and spleen are not enlarged.  There are no abdominal masses.  There are no abdominal bruits.  Extremities reveal good pedal pulses.  There is no phlebitis or edema.  There is no cyanosis or clubbing.  Strength is normal and symmetrical in all extremities.  There is no lateralizing weakness.  There are no sensory deficits.  The skin is warm and dry.  There is no rash.  EKG shows normal sinus rhythm and left axis deviation and no ischemic changes.  QTc interval is normal at 435 ms    Assessment / Plan: Continue present medication.  Recheck in 3-4 weeks for followup office visit and EKG.  He and his wife have a vacation out-of-state planned in early February.

## 2012-08-13 ENCOUNTER — Encounter: Payer: Self-pay | Admitting: Cardiology

## 2012-08-13 ENCOUNTER — Ambulatory Visit (INDEPENDENT_AMBULATORY_CARE_PROVIDER_SITE_OTHER): Payer: BC Managed Care – PPO | Admitting: Cardiology

## 2012-08-13 VITALS — BP 122/80 | HR 71 | Resp 18 | Ht 71.0 in | Wt 239.8 lb

## 2012-08-13 DIAGNOSIS — I4891 Unspecified atrial fibrillation: Secondary | ICD-10-CM

## 2012-08-13 DIAGNOSIS — I480 Paroxysmal atrial fibrillation: Secondary | ICD-10-CM

## 2012-08-13 DIAGNOSIS — K219 Gastro-esophageal reflux disease without esophagitis: Secondary | ICD-10-CM

## 2012-08-13 NOTE — Assessment & Plan Note (Signed)
The patient has not been expressing any recent symptoms of GERD.

## 2012-08-13 NOTE — Patient Instructions (Addendum)
Your physician recommends that you continue on your current medications as directed. Please refer to the Current Medication list given to you today.  Your physician recommends that you schedule a follow-up appointment in: 3 month ov/ekg 

## 2012-08-13 NOTE — Assessment & Plan Note (Signed)
The patient has noted occasional brief palpitations which suggests isolated PVC or PAC but he has had no sustained arrhythmias.. we will continue him on his current dose of flecainide and Cardizem and baby aspirin

## 2012-08-13 NOTE — Progress Notes (Signed)
Cesar Wheeler Date of Birth:  1955-08-02 [facility] [address] [place], [place]  [address] [phone]  Fax   [phone]  HPI: This pleasant 57 year old gentleman is seen for a scheduled followup office visit. He has a past history of paroxysmal atrial fibrillation. He does not have any history of congestive heart failure, diabetes, high blood pressure, or previous stroke. His Chadss score is 0. He is on daily aspirin. His echocardiogram 02/25/12 showed an ejection fraction of 55-65% with mild mitral regurgitation and his atrial size was at the upper limit of normal. The patient had an episode of atrial fibrillation on 02/23/2012. He has had subsequent episodes on November 8, November 17, and November 24.  He had 3 additional episodes, on December 7, December 15, and December 19. He is on Cardizem CD 300 mg daily every day. He has been using additional Cardizem as a pill in the pocket and it usually works within a few hours. Several of his episodes have occurred after drinking carbonated drinks, and these were drinks without caffeine.  Of note is that the patient does not have any history of ischemic heart disease. He states that he had a stress test at [facility] about a year ago which was normal.  In December 2013 the patient was begun on flecainide 100 mg twice a day and since then has had no further episodes of atrial fibrillation.  He remains on Cardizem 300 mg daily.  He has been feeling well.  He is getting ready to go on a cruise to the [place].  Current Outpatient Prescriptions  Medication Sig Dispense Refill  . aspirin 81 MG tablet Take 81 mg by mouth daily.      Marland Kitchen diltiazem (CARDIZEM CD) 300 MG 24 hr capsule Take 1 capsule (300 mg total) by mouth daily.  30 capsule  5  . flecainide (TAMBOCOR) 100 MG tablet Take 1 tablet (100 mg total) by mouth every 12 (twelve) hours.  60 tablet  5  . fluticasone (FLONASE) 50 MCG/ACT nasal spray       . omeprazole (PRILOSEC OTC)  20 MG tablet Take 20 mg by mouth daily.        Allergies  Allergen Reactions  . Penicillins     Patient Active Problem List  Diagnosis  . PAF (paroxysmal atrial fibrillation)  . GERD (gastroesophageal reflux disease)    History  Smoking status  . Never Smoker   Smokeless tobacco  . Not on file    History  Alcohol Use: Not on file    No family history on file.  Review of Systems: The patient denies any heat or cold intolerance.  No weight gain or weight loss.  The patient denies headaches or blurry vision.  There is no cough or sputum production.  The patient denies dizziness.  There is no hematuria or hematochezia.  The patient denies any muscle aches or arthritis.  The patient denies any rash.  The patient denies frequent falling or instability.  There is no history of depression or anxiety.  All other systems were reviewed and are negative.   Physical Exam: Filed Vitals:   08/13/12 0907  BP: 122/80  Pulse: 71  Resp: 18   The general appearance reveals a well-developed well-nourished gentleman in no distress.The head and neck exam reveals pupils equal and reactive.  Extraocular movements are full.  There is no scleral icterus.  The mouth and pharynx are normal.  The neck is supple.  The carotids reveal  no bruits.  The jugular venous pressure is normal.  The  thyroid is not enlarged.  There is no lymphadenopathy.  The chest is clear to percussion and auscultation.  There are no rales or rhonchi.  Expansion of the chest is symmetrical.  The precordium is quiet.  The first heart sound is normal.  The second heart sound is physiologically split.  There is no murmur gallop rub or click.  There is no abnormal lift or heave.  The abdomen is soft and nontender.  The bowel sounds are normal.  The liver and spleen are not enlarged.  There are no abdominal masses.  There are no abdominal bruits.  Extremities reveal good pedal pulses.  There is no phlebitis or edema.  There is no cyanosis  or clubbing.  Strength is normal and symmetrical in all extremities.  There is no lateralizing weakness.  There are no sensory deficits.  The skin is warm and dry.  There is no rash.     Assessment / Plan: Continue same medication.  If he has an episode of atrial fibrillation we talked about using 200 mg flecainide as a pill in the pocket in between his every 12 hours doses if necessary.  We will plan to see him in 3 months for followup office visit and EKG.  No restrictions on activity but I advised against him doing scuba diving.

## 2012-08-17 ENCOUNTER — Ambulatory Visit: Payer: BC Managed Care – PPO | Admitting: Cardiology

## 2012-11-10 ENCOUNTER — Ambulatory Visit (INDEPENDENT_AMBULATORY_CARE_PROVIDER_SITE_OTHER): Payer: BC Managed Care – PPO | Admitting: Cardiology

## 2012-11-10 ENCOUNTER — Encounter: Payer: Self-pay | Admitting: Cardiology

## 2012-11-10 VITALS — BP 124/76 | HR 63 | Ht 71.0 in | Wt 241.0 lb

## 2012-11-10 DIAGNOSIS — K219 Gastro-esophageal reflux disease without esophagitis: Secondary | ICD-10-CM

## 2012-11-10 DIAGNOSIS — I4891 Unspecified atrial fibrillation: Secondary | ICD-10-CM

## 2012-11-10 DIAGNOSIS — I480 Paroxysmal atrial fibrillation: Secondary | ICD-10-CM

## 2012-11-10 MED ORDER — DILTIAZEM HCL ER COATED BEADS 300 MG PO CP24: 300.0000 mg | ORAL_CAPSULE | Freq: Every day | ORAL | Status: DC

## 2012-11-10 MED ORDER — FLECAINIDE ACETATE 100 MG PO TABS: 100.0000 mg | ORAL_TABLET | Freq: Two times a day (BID) | ORAL | Status: DC

## 2012-11-10 NOTE — Progress Notes (Signed)
Cesar Wheeler Date of Birth:  1956-04-05 [facility] [address] [place], [place]  [address] [phone]  Fax   [phone]  HPI: This pleasant 57 year old gentleman is seen for a scheduled office visit. He has a past history of paroxysmal atrial fibrillation. He does not have any history of congestive heart failure, diabetes, high blood pressure, or previous stroke. His Chadss score is 0. He is on daily aspirin. His echocardiogram 02/25/12 showed an ejection fraction of 55-65% with mild mitral regurgitation and his atrial size was at the upper limit of normal. The patient had an episode of atrial fibrillation on 02/23/2012. He has had subsequent episodes on November 8, November 17, and November 24. Since we last saw him he has had 3 additional episodes, on December 7, December 15, and December 19. He is on Cardizem CD 300 mg daily every day. He has been using additional Cardizem as a pill in the pocket and it usually works within a few hours. Several of his episodes have occurred after drinking carbonated drinks, and these were drinks without caffeine.  Since the patient was placed on flecainide 100 mg every 12 hours he has had no further episodes of atrial fibrillation. Of note is that the patient does not have any history of ischemic heart disease. He states that he had a stress test at [facility] about a year ago which was normal.  The patient is exercising regularly using a treadmill 3 times a week.   Current Outpatient Prescriptions  Medication Sig Dispense Refill  . aspirin 81 MG tablet Take 81 mg by mouth daily.      Marland Kitchen diltiazem (CARDIZEM CD) 300 MG 24 hr capsule Take 1 capsule (300 mg total) by mouth daily.  30 capsule  11  . flecainide (TAMBOCOR) 100 MG tablet Take 1 tablet (100 mg total) by mouth every 12 (twelve) hours.  60 tablet  11  . fluticasone (FLONASE) 50 MCG/ACT nasal spray       . Glucosamine-Chondroitin (GLUCOSAMINE CHONDR COMPLEX PO) Take by mouth daily.       . Omega-3 Fatty Acids (FISH OIL PO) Take by mouth daily.       No current facility-administered medications for this visit.    Allergies  Allergen Reactions  . Penicillins     Patient Active Problem List  Diagnosis  . PAF (paroxysmal atrial fibrillation)  . GERD (gastroesophageal reflux disease)    History  Smoking status  . Never Smoker   Smokeless tobacco  . Not on file    History  Alcohol Use: Not on file    No family history on file.  Review of Systems: The patient denies any heat or cold intolerance.  No weight gain or weight loss.  The patient denies headaches or blurry vision.  There is no cough or sputum production.  The patient denies dizziness.  There is no hematuria or hematochezia.  The patient denies any muscle aches or arthritis.  The patient denies any rash.  The patient denies frequent falling or instability.  There is no history of depression or anxiety.  All other systems were reviewed and are negative.   Physical Exam: Filed Vitals:   11/10/12 0837  BP: 124/76  Pulse: 63   the general appearance reveals a well-developed well-nourished gentleman in no distress.The head and neck exam reveals pupils equal and reactive.  Extraocular movements are full.  There is no scleral icterus.  The mouth and pharynx are normal.  The neck is  supple.  The carotids reveal no bruits.  The jugular venous pressure is normal.  The  thyroid is not enlarged.  There is no lymphadenopathy.  The chest is clear to percussion and auscultation.  There are no rales or rhonchi.  Expansion of the chest is symmetrical.  The precordium is quiet.  The first heart sound is normal.  The second heart sound is physiologically split.  There is no murmur gallop rub or click.  There is no abnormal lift or heave.  The abdomen is soft and nontender.  The bowel sounds are normal.  The liver and spleen are not enlarged.  There are no abdominal masses.  There are no abdominal bruits.  Extremities reveal  good pedal pulses.  There is no phlebitis or edema.  There is no cyanosis or clubbing.  Strength is normal and symmetrical in all extremities.  There is no lateralizing weakness.  There are no sensory deficits.  The skin is warm and dry.  There is no rash.  EKG today confirms normal sinus rhythm with first degree AB block and left axis deviation.  QTc interval is 437 ms.    Assessment / Plan: Continue same medication.  Recheck in 4 months for followup office visit and EKG.  We refilled his diltiazem and his flecainide today.  He mentioned that he is no longer on Synthroid and at this thyroid function studies have remained acceptable.

## 2012-11-10 NOTE — Patient Instructions (Signed)
Your physician recommends that you continue on your current medications as directed. Please refer to the Current Medication list given to you today.  Your physician recommends that you schedule a follow-up appointment in: 4 MONTH OV/EKG  

## 2012-11-10 NOTE — Assessment & Plan Note (Signed)
No further episodes of paroxysmal atrial fibrillation since starting flecainide 100 mg twice a day

## 2012-11-10 NOTE — Assessment & Plan Note (Signed)
The patient has not had any recurrence of his GERD symptoms.  He is avoiding caffeine.

## 2013-02-20 ENCOUNTER — Emergency Department (HOSPITAL_BASED_OUTPATIENT_CLINIC_OR_DEPARTMENT_OTHER)
Admission: EM | Admit: 2013-02-20 | Discharge: 2013-02-20 | Disposition: A | Discharge status: Home / Self Care | Payer: BC Managed Care – PPO | Attending: Emergency Medicine | Admitting: Emergency Medicine

## 2013-02-20 ENCOUNTER — Emergency Department (HOSPITAL_BASED_OUTPATIENT_CLINIC_OR_DEPARTMENT_OTHER): Payer: BC Managed Care – PPO

## 2013-02-20 ENCOUNTER — Encounter (HOSPITAL_BASED_OUTPATIENT_CLINIC_OR_DEPARTMENT_OTHER): Payer: Self-pay | Admitting: *Deleted

## 2013-02-20 DIAGNOSIS — I4891 Unspecified atrial fibrillation: Secondary | ICD-10-CM | POA: Insufficient documentation

## 2013-02-20 DIAGNOSIS — X58XXXA Exposure to other specified factors, initial encounter: Secondary | ICD-10-CM | POA: Insufficient documentation

## 2013-02-20 DIAGNOSIS — Z880 Allergy status to penicillin: Secondary | ICD-10-CM | POA: Insufficient documentation

## 2013-02-20 DIAGNOSIS — Z7982 Long term (current) use of aspirin: Secondary | ICD-10-CM | POA: Insufficient documentation

## 2013-02-20 DIAGNOSIS — IMO0002 Reserved for concepts with insufficient information to code with codable children: Secondary | ICD-10-CM | POA: Insufficient documentation

## 2013-02-20 DIAGNOSIS — Y9317 Activity, water skiing and wake boarding: Secondary | ICD-10-CM | POA: Insufficient documentation

## 2013-02-20 DIAGNOSIS — Y929 Unspecified place or not applicable: Secondary | ICD-10-CM | POA: Insufficient documentation

## 2013-02-20 DIAGNOSIS — IMO0001 Reserved for inherently not codable concepts without codable children: Secondary | ICD-10-CM | POA: Insufficient documentation

## 2013-02-20 DIAGNOSIS — T148XXA Other injury of unspecified body region, initial encounter: Secondary | ICD-10-CM

## 2013-02-20 NOTE — ED Notes (Signed)
Pt reports right leg pain after waterskiing 1 hour ago- ambulating with crutches

## 2013-02-20 NOTE — ED Notes (Signed)
MD at bedside. 

## 2013-02-20 NOTE — ED Notes (Signed)
Patient transported to X-ray 

## 2013-02-20 NOTE — ED Provider Notes (Signed)
CSN: [id number]     Arrival date & time 02/20/13  1618 History  This chart was scribed for Doug Sou, MD by Ladona Ridgel Day, ED scribe. This patient was seen in room MH09/MH09 and the patient's care was started at 1655.   First MD Initiated Contact with Patient 02/20/13 1655     Chief Complaint  Patient presents with  . Leg Pain   Patient is a 57 y.o. male presenting with leg pain. The history is provided by the patient. No language interpreter was used.  Leg Pain Location:  Leg Time since incident:  1 hour Leg location:  R upper leg Pain details:    Quality:  Aching   Radiates to:  Does not radiate   Severity:  Mild   Onset quality:  Sudden   Duration:  1 hour   Timing:  Constant   Progression:  Unchanged Chronicity:  New Dislocation: no   Foreign body present:  No foreign bodies Prior injury to area:  No Relieved by:  Nothing Worsened by:  Bearing weight and flexion Ineffective treatments:  None tried Associated symptoms: no back pain    HPI Comments: Cesar Wheeler is a 57 y.o. male who presents to the Emergency Department complaining of sudden onset, moderate, right posterior thigh pain while water skiing this PM PTA. He states he felt a muscle pull in his right thigh when he was water skiing and has had aching pain and difficulty ambulating since incident. He denies any other injuries. He has not taken any medicines for this problem.  He has hx of A-fib and controlled well w/cardizem and baby ASA.  He denies previous surgeries, does not smoke/drink alochol/no drugs. He is allergic to PCN.  Past Medical History  Diagnosis Date  . Atrial fibrillation   . Abnormal blood test 02/24/2012   Past Surgical History  Procedure Laterality Date  . Tonsillectomy    . Finger fracture surgery     No family history on file. History  Substance Use Topics  . Smoking status: Not on file  . Smokeless tobacco: Never Used  . Alcohol Use: No    Review of Systems  Constitutional:  Negative.   Gastrointestinal: Negative for nausea, vomiting and abdominal pain.  Musculoskeletal: Positive for myalgias. Negative for back pain.       Right upper, posterior thigh pain  Skin: Negative.   Neurological: Negative.  Negative for weakness.  Psychiatric/Behavioral: Negative.   All other systems reviewed and are negative.   A complete 10 system review of systems was obtained and all systems are negative except as noted in the HPI and PMH.   Allergies  Penicillins  Home Medications   Current Outpatient Rx  Name  Route  Sig  Dispense  Refill  . aspirin 81 MG tablet   Oral   Take 81 mg by mouth daily.         Marland Kitchen diltiazem (CARDIZEM CD) 300 MG 24 hr capsule   Oral   Take 1 capsule (300 mg total) by mouth daily.   30 capsule   11   . flecainide (TAMBOCOR) 100 MG tablet   Oral   Take 1 tablet (100 mg total) by mouth every 12 (twelve) hours.   60 tablet   11   . fluticasone (FLONASE) 50 MCG/ACT nasal spray               . Glucosamine-Chondroitin (GLUCOSAMINE CHONDR COMPLEX PO)   Oral   Take by mouth daily.         Marland Kitchen  Omega-3 Fatty Acids (FISH OIL PO)   Oral   Take by mouth daily.          Triage Vitals: BP 131/86  Pulse 90  Temp(Src) 98.5 F (36.9 C) (Oral)  Resp 16  Ht 5\' 11"  (1.803 m)  Wt 230 lb (104.327 kg)  BMI 32.09 kg/m2  SpO2 99% Physical Exam  Nursing note and vitals reviewed. Constitutional: He appears well-developed and well-nourished.  HENT:  Head: Normocephalic and atraumatic.  Eyes: Conjunctivae are normal. Pupils are equal, round, and reactive to light.  Neck: Neck supple. No tracheal deviation present. No thyromegaly present.  Cardiovascular: Normal rate and regular rhythm.   No murmur heard. Pulmonary/Chest: Effort normal and breath sounds normal.  Abdominal: Soft. Bowel sounds are normal. He exhibits no distension. There is no tenderness.  Musculoskeletal: Normal range of motion. He exhibits tenderness. He exhibits no  edema.  RLEminmally Tender over right posterior thigh, no swelling/ecchymosis, no defformity. dp pulse 2   Neurological: He is alert. Coordination normal.  Skin: Skin is warm and dry. No rash noted.  Psychiatric: He has a normal mood and affect.    ED Course   Procedures (including critical care time) DIAGNOSTIC STUDIES: Oxygen Saturation is 99% on room air, normal by my interpretation.    COORDINATION OF CARE: At 31 PM Discussed treatment plan with patient which includes right posterior thigh x-ray. Patient agrees.   Labs Reviewed - No data to display No results found. No diagnosis found. Xray viewed by me Results for orders placed in visit on 02/26/12  CARDIAC PANEL      Result Value Range   Total CK 559 (*) 7 - 232 U/L   CK-MB 13.0 (*) 0.3 - 4.0 ng/mL   Relative Index 2.3  0.0 - 2.5 calc  T4, FREE      Result Value Range   Free T4 0.50 (*) 0.60 - 1.60 ng/dL  TSH      Result Value Range   TSH 7.49 (*) 0.35 - 5.50 uIU/mL  BASIC METABOLIC PANEL      Result Value Range   Sodium 138  135 - 145 mEq/L   Potassium 4.0  3.5 - 5.1 mEq/L   Chloride 104  96 - 112 mEq/L   CO2 26  19 - 32 mEq/L   Glucose, Bld 84  70 - 99 mg/dL   BUN 15  6 - 23 mg/dL   Creatinine, Ser 1.1  0.4 - 1.5 mg/dL   Calcium 9.4  8.4 - 86.5 mg/dL   GFR 78.46  >96.29 mL/min  HEPATIC FUNCTION PANEL      Result Value Range   Total Bilirubin 0.9  0.3 - 1.2 mg/dL   Bilirubin, Direct 0.1  0.0 - 0.3 mg/dL   Alkaline Phosphatase 58  39 - 117 U/L   AST 36  0 - 37 U/L   ALT 56 (*) 0 - 53 U/L   Total Protein 7.1  6.0 - 8.3 g/dL   Albumin 4.3  3.5 - 5.2 g/dL   Dg Femur Right  11/21/8411   *RADIOLOGY REPORT*  Clinical Data: Pain, water skiing injury  RIGHT FEMUR - 2 VIEW  Comparison: None.  Findings: No fracture or dislocation is seen.  The joint spaces are preserved.  The visualized soft tissues are unremarkable.  IMPRESSION: No fracture or dislocation is seen.   Original Report Authenticated By: Charline Bills, M.D.    MDM   Plan ibuprofen, ice, rest followup Dr. Thurston Hole when  necessary(pt request) Diagnosis muscle strain   Doug Sou, MD 02/20/13 1749

## 2013-03-10 ENCOUNTER — Encounter: Payer: Self-pay | Admitting: Cardiology

## 2013-03-10 ENCOUNTER — Other Ambulatory Visit: Payer: Self-pay | Admitting: *Deleted

## 2013-03-10 ENCOUNTER — Ambulatory Visit (INDEPENDENT_AMBULATORY_CARE_PROVIDER_SITE_OTHER): Payer: BC Managed Care – PPO | Admitting: Cardiology

## 2013-03-10 VITALS — BP 124/78 | HR 65 | Resp 16 | Ht 71.0 in | Wt 242.0 lb

## 2013-03-10 DIAGNOSIS — I480 Paroxysmal atrial fibrillation: Secondary | ICD-10-CM

## 2013-03-10 DIAGNOSIS — I4891 Unspecified atrial fibrillation: Secondary | ICD-10-CM

## 2013-03-10 DIAGNOSIS — K219 Gastro-esophageal reflux disease without esophagitis: Secondary | ICD-10-CM

## 2013-03-10 NOTE — Patient Instructions (Addendum)
Your physician recommends that you continue on your current medications as directed. Please refer to the Current Medication list given to you today.  Your physician recommends that you schedule a follow-up appointment in: 4 month ov/ekg 

## 2013-03-10 NOTE — Assessment & Plan Note (Signed)
No episodes of atrial fib since we last saw him.  He occasionally forgets to take his evening dose of flecainide.

## 2013-03-10 NOTE — Progress Notes (Signed)
Yvone Neu Date of Birth:  18-Feb-1956 [facility] [address] [place], [place]  [address] [phone]  Fax   [phone]  HPI: This pleasant 57 year old gentleman is seen for a scheduled office visit. He has a past history of paroxysmal atrial fibrillation. He does not have any history of congestive heart failure, diabetes, high blood pressure, or previous stroke. His Chadss score is 0. He is on daily aspirin. His echocardiogram 02/25/12 showed an ejection fraction of 55-65% with mild mitral regurgitation and his atrial size was at the upper limit of normal.  The patient is doing well on flecainide and has had no episodes of atrial fibrillation since 07/15/12 Of note is that the patient does not have any history of ischemic heart disease. He states that he had a stress test at [facility] about a year ago which was normal. The patient is exercising regularly using a treadmill 3 times a week.  He has not had any excising the past 2 weeks because he tore a hamstring muscle water skiing.  Current Outpatient Prescriptions  Medication Sig Dispense Refill  . aspirin 81 MG tablet Take 81 mg by mouth daily.      Marland Kitchen diltiazem (CARDIZEM CD) 300 MG 24 hr capsule Take 1 capsule (300 mg total) by mouth daily.  30 capsule  11  . flecainide (TAMBOCOR) 100 MG tablet Take 1 tablet (100 mg total) by mouth every 12 (twelve) hours.  60 tablet  11  . fluticasone (FLONASE) 50 MCG/ACT nasal spray       . furosemide (LASIX) 20 MG tablet Take 20 mg by mouth as needed.      . Glucosamine-Chondroitin (GLUCOSAMINE CHONDR COMPLEX PO) Take by mouth daily.      . Omega-3 Fatty Acids (FISH OIL PO) Take by mouth daily.       No current facility-administered medications for this visit.    Allergies  Allergen Reactions  . Penicillins     Patient Active Problem List   Diagnosis Date Noted  . GERD (gastroesophageal reflux disease) 07/09/2012  . PAF (paroxysmal atrial fibrillation) 02/24/2012     History  Smoking status  . Never Smoker   Smokeless tobacco  . Never Used    History  Alcohol Use No    No family history on file.  Review of Systems: The patient denies any heat or cold intolerance.  No weight gain or weight loss.  The patient denies headaches or blurry vision.  There is no cough or sputum production.  The patient denies dizziness.  There is no hematuria or hematochezia.  The patient denies any muscle aches or arthritis.  The patient denies any rash.  The patient denies frequent falling or instability.  There is no history of depression or anxiety.  All other systems were reviewed and are negative.   Physical Exam: Filed Vitals:   03/10/13 0908  BP: 124/78  Pulse: 65  Resp: 16   the general appearance reveals a well-developed well-nourished gentleman in no distress.  His weight is up 1 pound since last visit.  He had lost about 10 pounds until he injured his hamstring muscle and has been sedentary.The head and neck exam reveals pupils equal and reactive.  Extraocular movements are full.  There is no scleral icterus.  The mouth and pharynx are normal.  The neck is supple.  The carotids reveal no bruits.  The jugular venous pressure is normal.  The  thyroid is not enlarged.  There is no  lymphadenopathy.  The chest is clear to percussion and auscultation.  There are no rales or rhonchi.  Expansion of the chest is symmetrical.  The precordium is quiet.  The first heart sound is normal.  The second heart sound is physiologically split.  There is no murmur gallop rub or click.  There is no abnormal lift or heave.  The abdomen is soft and nontender.  The bowel sounds are normal.  The liver and spleen are not enlarged.  There are no abdominal masses.  There are no abdominal bruits.  Extremities reveal good pedal pulses.  There is no phlebitis or edema.  There is no cyanosis or clubbing.  Strength is normal and symmetrical in all extremities.  There is no lateralizing weakness.   There are no sensory deficits.  The skin is warm and dry.  There is no rash.  EKG shows normal sinus rhythm and is within normal limits.    Assessment / Plan: Patient is doing well on current combination diltiazem and flecainide. Continue same medication.  Recheck 4 months for office visit and EKG

## 2013-03-10 NOTE — Assessment & Plan Note (Signed)
The patient has not had any recurrence of GERD symptoms

## 2013-07-02 ENCOUNTER — Ambulatory Visit (INDEPENDENT_AMBULATORY_CARE_PROVIDER_SITE_OTHER): Payer: BC Managed Care – PPO | Admitting: Cardiology

## 2013-07-02 ENCOUNTER — Encounter: Payer: Self-pay | Admitting: Cardiology

## 2013-07-02 VITALS — BP 133/83 | HR 68 | Ht 71.0 in | Wt 240.2 lb

## 2013-07-02 DIAGNOSIS — K219 Gastro-esophageal reflux disease without esophagitis: Secondary | ICD-10-CM

## 2013-07-02 DIAGNOSIS — I480 Paroxysmal atrial fibrillation: Secondary | ICD-10-CM

## 2013-07-02 DIAGNOSIS — I4891 Unspecified atrial fibrillation: Secondary | ICD-10-CM

## 2013-07-02 NOTE — Patient Instructions (Signed)
Your physician recommends that you continue on your current medications as directed. Please refer to the Current Medication list given to you today.  Your physician wants you to follow-up in: 6 month ov/ekg You will receive a reminder letter in the mail two months in advance. If you don't receive a letter, please call our office to schedule the follow-up appointment.  

## 2013-07-02 NOTE — Assessment & Plan Note (Signed)
The patient has had no recurrent atrial fib.  He is doing very well on his current dose of flecainide

## 2013-07-02 NOTE — Assessment & Plan Note (Signed)
The patient has occasional indigestion .  Takes over-the-counter remedies.  He has never had a colonoscopy and is scheduled for one with equal GI on Monday, December 15.

## 2013-07-02 NOTE — Progress Notes (Signed)
Cesar Wheeler Date of Birth:  [birth year] [address] [place], [place]  [address] [phone]  Fax   [phone]  HPI: This pleasant 57 year old gentleman is seen for a scheduled office visit. He has a past history of paroxysmal atrial fibrillation. He does not have any history of congestive heart failure, diabetes, high blood pressure, or previous stroke. His Chadss score is 0. He is on daily aspirin. His echocardiogram 02/25/12 showed an ejection fraction of 55-65% with mild mitral regurgitation and his atrial size was at the upper limit of normal.  The patient is doing well on flecainide and has had no episodes of atrial fibrillation since 07/15/12 Of note is that the patient does not have any history of ischemic heart disease. He states that he had a stress test at [facility] about a year ago which was normal. The patient is exercising regularly using a treadmill 3 times a week.  He is watching his diet.  His weight is down 2 pounds today but was down further until Thanksgiving.  At Thanksgiving he took his children and grandchildren on a cruise to the [place]. Current Outpatient Prescriptions  Medication Sig Dispense Refill  . aspirin 81 MG tablet Take 81 mg by mouth daily.      Marland Kitchen diltiazem (CARDIZEM CD) 300 MG 24 hr capsule Take 1 capsule (300 mg total) by mouth daily.  30 capsule  11  . flecainide (TAMBOCOR) 100 MG tablet Take 1 tablet (100 mg total) by mouth every 12 (twelve) hours.  60 tablet  11  . fluticasone (FLONASE) 50 MCG/ACT nasal spray       . Glucosamine-Chondroitin (GLUCOSAMINE CHONDR COMPLEX PO) Take by mouth daily.      . Omega-3 Fatty Acids (FISH OIL PO) Take by mouth daily.       No current facility-administered medications for this visit.    Allergies  Allergen Reactions  . Penicillins     Patient Active Problem List   Diagnosis Date Noted  . GERD (gastroesophageal reflux disease) 07/09/2012  . PAF (paroxysmal atrial fibrillation) 02/24/2012     History  Smoking status  . Never Smoker   Smokeless tobacco  . Never Used    History  Alcohol Use No    No family history on file.  Review of Systems: The patient denies any heat or cold intolerance.  No weight gain or weight loss.  The patient denies headaches or blurry vision.  There is no cough or sputum production.  The patient denies dizziness.  There is no hematuria or hematochezia.  The patient denies any muscle aches or arthritis.  The patient denies any rash.  The patient denies frequent falling or instability.  There is no history of depression or anxiety.  All other systems were reviewed and are negative.   Physical Exam: Filed Vitals:   07/02/13 0851  BP: 133/83  Pulse: 68   the general appearance reveals a well-developed well-nourished gentleman in no distress.  His weight is up 1 pound since last visit.  He had lost about 10 pounds until he injured his hamstring muscle and has been sedentary.The head and neck exam reveals pupils equal and reactive.  Extraocular movements are full.  There is no scleral icterus.  The mouth and pharynx are normal.  The neck is supple.  The carotids reveal no bruits.  The jugular venous pressure is normal.  The  thyroid is not enlarged.  There is no lymphadenopathy.  The chest is clear  to percussion and auscultation.  There are no rales or rhonchi.  Expansion of the chest is symmetrical.  The precordium is quiet.  The first heart sound is normal.  The second heart sound is physiologically split.  There is no murmur gallop rub or click.  There is no abnormal lift or heave.  The abdomen is soft and nontender.  The bowel sounds are normal.  The liver and spleen are not enlarged.  There are no abdominal masses.  There are no abdominal bruits.  Extremities reveal good pedal pulses.  There is no phlebitis or edema.  There is no cyanosis or clubbing.  Strength is normal and symmetrical in all extremities.  There is no lateralizing weakness.  There are  no sensory deficits.  The skin is warm and dry.  There is no rash.  EKG shows normal sinus rhythm and is within normal limits.    Assessment / Plan: Patient is doing well on current combination diltiazem and flecainide. Continue same medication.  Recheck 6 months for office visit and EKG

## 2013-11-18 ENCOUNTER — Other Ambulatory Visit: Payer: Self-pay | Admitting: Cardiology

## 2013-12-29 ENCOUNTER — Encounter: Payer: Self-pay | Admitting: Cardiology

## 2013-12-29 ENCOUNTER — Ambulatory Visit (INDEPENDENT_AMBULATORY_CARE_PROVIDER_SITE_OTHER): Payer: BC Managed Care – PPO | Admitting: Cardiology

## 2013-12-29 VITALS — BP 122/78 | HR 67 | Ht 71.0 in | Wt 237.0 lb

## 2013-12-29 DIAGNOSIS — I4891 Unspecified atrial fibrillation: Secondary | ICD-10-CM

## 2013-12-29 DIAGNOSIS — I480 Paroxysmal atrial fibrillation: Secondary | ICD-10-CM

## 2013-12-29 DIAGNOSIS — K219 Gastro-esophageal reflux disease without esophagitis: Secondary | ICD-10-CM

## 2013-12-29 MED ORDER — DILTIAZEM HCL ER COATED BEADS 300 MG PO CP24: ORAL_CAPSULE | ORAL | Status: DC

## 2013-12-29 MED ORDER — FLECAINIDE ACETATE 100 MG PO TABS: ORAL_TABLET | ORAL | Status: DC

## 2013-12-29 NOTE — Patient Instructions (Signed)
Your physician recommends that you continue on your current medications as directed. Please refer to the Current Medication list given to you today.  Your physician wants you to follow-up in: 6 month ov/ekg You will receive a reminder letter in the mail two months in advance. If you don't receive a letter, please call our office to schedule the follow-up appointment.  

## 2013-12-29 NOTE — Progress Notes (Signed)
Cesar Wheeler Date of Birth:  [birth year] [facility] [address] [place], [place]  [address] [phone]        Fax   [phone]   History of Present Illness: This pleasant 58 year old gentleman is seen for a scheduled office visit. He has a past history of paroxysmal atrial fibrillation. He does not have any history of congestive heart failure, diabetes, high blood pressure, or previous stroke. His Chadss score is 0. He is on daily aspirin. His echocardiogram 02/25/12 showed an ejection fraction of 55-65% with mild mitral regurgitation and his atrial size was at the upper limit of normal. The patient is doing well on flecainide and has had no episodes of atrial fibrillation since last visit.  About a week after his last visit he did have one brief episode of atrial fibrillation when he forgot to take his flecainide and also was served probable regular coffee instead of decaf coffee at a diner.  He went home, took his flecainide and converted within several hours. Of note is that the patient does not have any history of ischemic heart disease. He states that he had a stress test at [facility] in the past which was normal. The patient is exercising regularly using a treadmill 3 times a week. He is watching his diet. His weight is down 3 pounds today    Current Outpatient Prescriptions  Medication Sig Dispense Refill  . aspirin 81 MG tablet Take 81 mg by mouth daily.      Marland Kitchen diltiazem (CARTIA XT) 300 MG 24 hr capsule take 1 capsule by mouth once daily  90 capsule  3  . flecainide (TAMBOCOR) 100 MG tablet take 1 tablet by mouth every 12 hours  180 tablet  3  . fluticasone (FLONASE) 50 MCG/ACT nasal spray       . Glucosamine-Chondroitin (GLUCOSAMINE CHONDR COMPLEX PO) Take by mouth daily.      . Omega-3 Fatty Acids (FISH OIL PO) Take by mouth daily.       No current facility-administered medications for this visit.    Allergies  Allergen Reactions  . Penicillins      Patient Active Problem List   Diagnosis Date Noted  . GERD (gastroesophageal reflux disease) 07/09/2012  . PAF (paroxysmal atrial fibrillation) 02/24/2012    History  Smoking status  . Never Smoker   Smokeless tobacco  . Never Used    History  Alcohol Use No    No family history on file.  Review of Systems: Constitutional: no fever chills diaphoresis or fatigue or change in weight.  Head and neck: no hearing loss, no epistaxis, no photophobia or visual disturbance. Respiratory: No cough, shortness of breath or wheezing. Cardiovascular: No chest pain peripheral edema, palpitations. Gastrointestinal: No abdominal distention, no abdominal pain, no change in bowel habits hematochezia or melena. Genitourinary: No dysuria, no frequency, no urgency, no nocturia. Musculoskeletal:No arthralgias, no back pain, no gait disturbance or myalgias. Neurological: No dizziness, no headaches, no numbness, no seizures, no syncope, no weakness, no tremors. Hematologic: No lymphadenopathy, no easy bruising. Psychiatric: No confusion, no hallucinations, no sleep disturbance.    Physical Exam: Filed Vitals:   12/29/13 0824  BP: 122/78  Pulse: 67   the general appearance reveals a well-developed well-nourished gentleman in no distress.The head and neck exam reveals pupils equal and reactive.  Extraocular movements are full.  There is no scleral icterus.  The mouth and pharynx are normal.  The neck is supple.  The carotids reveal no bruits.  The jugular venous pressure is normal.  The  thyroid is not enlarged.  There is no lymphadenopathy.  The chest is clear to percussion and auscultation.  There are no rales or rhonchi.  Expansion of the chest is symmetrical.  The precordium is quiet.  The first heart sound is normal.  The second heart sound is physiologically split.  There is no murmur gallop rub or click.  There is no abnormal lift or heave.  The abdomen is soft and nontender.  The bowel sounds  are normal.  The liver and spleen are not enlarged.  There are no abdominal masses.  There are no abdominal bruits.  Extremities reveal good pedal pulses.  There is no phlebitis or edema.  There is no cyanosis or clubbing.  Strength is normal and symmetrical in all extremities.  There is no lateralizing weakness.  There are no sensory deficits.  The skin is warm and dry.  There is no rash.  EKG shows normal sinus rhythm with first degree AV block and is unchanged from 07/02/13   Assessment / Plan: 1.  Paroxysmal atrial fibrillation, suppressed on flecainide.  Chadsvasc score of 0 2. GERD  Plan: Continue current medication.  We refilled his flecainide is diltiazem today.  Recheck in 6 months for office visit and EKG.  Continue weight loss.  Continue to avoid caffeine.

## 2013-12-29 NOTE — Assessment & Plan Note (Signed)
He has not been having any problems from his include.  He reports that since we last saw him he had his scheduled colonoscopy which showed no lesions

## 2013-12-29 NOTE — Assessment & Plan Note (Signed)
The patient has had no TIA symptoms.  He has had no recurrence of his atrial fibrillation since starting the flecainide.

## 2014-02-01 ENCOUNTER — Other Ambulatory Visit: Payer: Self-pay | Admitting: Cardiology

## 2014-06-28 ENCOUNTER — Encounter: Payer: Self-pay | Admitting: Cardiology

## 2014-06-28 ENCOUNTER — Ambulatory Visit (INDEPENDENT_AMBULATORY_CARE_PROVIDER_SITE_OTHER): Payer: BC Managed Care – PPO | Admitting: Cardiology

## 2014-06-28 VITALS — BP 112/88 | HR 59 | Ht 71.0 in | Wt 240.0 lb

## 2014-06-28 DIAGNOSIS — I480 Paroxysmal atrial fibrillation: Secondary | ICD-10-CM

## 2014-06-28 DIAGNOSIS — K219 Gastro-esophageal reflux disease without esophagitis: Secondary | ICD-10-CM

## 2014-06-28 NOTE — Assessment & Plan Note (Signed)
The patient has not had any severe recent episodes of GERD.  It used to be that the GERD appear to trigger his atrial fibrillation.

## 2014-06-28 NOTE — Patient Instructions (Signed)
Your physician recommends that you continue on your current medications as directed. Please refer to the Current Medication list given to you today.  Your physician wants you to follow-up in: 6 MONTH OV /EKG You will receive a reminder letter in the mail two months in advance. If you don't receive a letter, please call our office to schedule the follow-up appointment.  

## 2014-06-28 NOTE — Assessment & Plan Note (Signed)
The patient has had no recurrent atrial fibrillation.  He will continue current dose of diltiazem and flecainide.  His QTc interval today is 451.

## 2014-06-28 NOTE — Progress Notes (Signed)
Cesar Wheeler Date of Birth:  15-Apr-1956 [facility] [address] [place], [place]  [id number] [phone]        Fax   [phone]   History of Present Illness: This pleasant 58 year old gentleman is seen for a scheduled office visit. He has a past history of paroxysmal atrial fibrillation. He does not have any history of congestive heart failure, diabetes, high blood pressure, or previous stroke. His Chadss score is 0. He is on daily aspirin. His echocardiogram 02/25/12 showed an ejection fraction of 55-65% with mild mitral regurgitation and his atrial size was at the upper limit of normal. The patient is doing well on flecainide and has had no episodes of atrial fibrillation since last visit.   Of note is that the patient does not have any history of ischemic heart disease. He states that he had [facility] in [facility] past which was normal. The patient is exercising regularly using a treadmill 3 times a week. He is watching his diet. His weight is  up slightly but he just came back from a holiday cruise.   Current Outpatient Prescriptions  Medication Sig Dispense Refill  . aspirin 81 MG tablet Take 81 mg by mouth daily.    Marland Kitchen. diltiazem (CARTIA XT) 300 MG 24 hr capsule take 1 capsule by mouth once daily 90 capsule 3  . flecainide (TAMBOCOR) 100 MG tablet take 1 tablet by mouth every 12 hours 180 tablet 3  . fluticasone (FLONASE) 50 MCG/ACT nasal spray Place 1 spray into both nostrils as needed.     . Glucosamine-Chondroitin (GLUCOSAMINE CHONDR COMPLEX PO) Take by mouth daily.    . Omega-3 Fatty Acids (FISH OIL PO) Take by mouth daily.     No current facility-administered medications for this visit.    Allergies  Allergen Reactions  . Penicillins     Patient Active Problem List   Diagnosis Date Noted  . GERD (gastroesophageal reflux disease) 07/09/2012  . PAF (paroxysmal atrial fibrillation) 02/24/2012    History  Smoking status  . Never Smoker     Smokeless tobacco  . Never Used    History  Alcohol Use No    Family History  Problem Relation Age of Onset  . Heart disease Mother   . Diabetes Mother   . Cancer - Lung Father   . Cancer - Other Sister     Review of Systems: Constitutional: no fever chills diaphoresis or fatigue or change in weight.  Head and neck: no hearing loss, no epistaxis, no photophobia or visual disturbance. Respiratory: No cough, shortness of breath or wheezing. Cardiovascular: No chest pain peripheral edema, palpitations. Gastrointestinal: No abdominal distention, no abdominal pain, no change in bowel habits hematochezia or melena. Genitourinary: No dysuria, no frequency, no urgency, no nocturia. Musculoskeletal:No arthralgias, no back pain, no gait disturbance or myalgias. Neurological: No dizziness, no headaches, no numbness, no seizures, no syncope, no weakness, no tremors. Hematologic: No lymphadenopathy, no easy bruising. Psychiatric: No confusion, no hallucinations, no sleep disturbance.    Physical Exam: Filed Vitals:   06/28/14 1610  BP: 112/88  Pulse: 59   the general appearance reveals a well-developed well-nourished gentleman in no distress.The head and neck exam reveals pupils equal and reactive.  Extraocular movements are full.  There is no scleral icterus.  The mouth and pharynx are normal.  The neck is supple.  The carotids reveal no bruits.  The jugular venous pressure is normal.  The  thyroid is not enlarged.  There is no lymphadenopathy.  The chest is clear to percussion and auscultation.  There are no rales or rhonchi.  Expansion of the chest is symmetrical.  The precordium is quiet.  The first heart sound is normal.  The second heart sound is physiologically split.  There is no murmur gallop rub or click.  There is no abnormal lift or heave.  The abdomen is soft and nontender.  The bowel sounds are normal.  The liver and spleen are not enlarged.  There are no abdominal masses.   There are no abdominal bruits.  Extremities reveal good pedal pulses.  There is no phlebitis or edema.  There is no cyanosis or clubbing.  Strength is normal and symmetrical in all extremities.  There is no lateralizing weakness.  There are no sensory deficits.  The skin is warm and dry.  There is no rash.  EKG today shows sinus bradycardia at 59/m and no ischemic changes.  No significant change since prior tracing.   Assessment / Plan: 1.  Paroxysmal atrial fibrillation, suppressed on flecainide.  Chadsvasc score of 0 2. GERD  Plan: Continue current medication.  Recheck in 6 months for office visit and EKG.  Continue weight loss.  Continue to avoid caffeine.

## 2014-12-26 ENCOUNTER — Encounter: Payer: Self-pay | Admitting: Cardiology

## 2014-12-26 ENCOUNTER — Ambulatory Visit (INDEPENDENT_AMBULATORY_CARE_PROVIDER_SITE_OTHER): Payer: BLUE CROSS/BLUE SHIELD | Admitting: Cardiology

## 2014-12-26 VITALS — BP 118/80 | HR 62 | Ht 71.0 in | Wt 240.8 lb

## 2014-12-26 DIAGNOSIS — K219 Gastro-esophageal reflux disease without esophagitis: Secondary | ICD-10-CM | POA: Diagnosis not present

## 2014-12-26 DIAGNOSIS — I480 Paroxysmal atrial fibrillation: Secondary | ICD-10-CM

## 2014-12-26 MED ORDER — DILTIAZEM HCL ER COATED BEADS 300 MG PO CP24: ORAL_CAPSULE | ORAL | Status: DC

## 2014-12-26 MED ORDER — FLECAINIDE ACETATE 100 MG PO TABS: ORAL_TABLET | ORAL | Status: DC

## 2014-12-26 NOTE — Progress Notes (Signed)
Cardiology Office Note   Date:  12/26/2014   ID:  Cesar Neuony L Arzuaga, DOB 08-07-1955, MRN [id number]  PCP:  Gweneth DimitriMCNEILL,WENDY, MD  Cardiologist: Cassell Clementhomas Stanisha Lorenz MD  No chief complaint on file.     History of Present Illness: Cesar Wheeler is a 59 y.o. male who presents for a six-month follow-up visit.  This pleasant 59 year old gentleman is seen for a scheduled office visit. He has a past history of paroxysmal atrial fibrillation. He does not have any history of congestive heart failure, diabetes, high blood pressure, or previous stroke. His Chadss score is 0. He is on daily aspirin. His echocardiogram 02/25/12 showed an ejection fraction of 55-65% with mild mitral regurgitation and his atrial size was at the upper limit of normal. The patient is doing well on flecainide .  Since last visit he had one episode of atrial fibrillation early in the morning of March 31 after awakening from sleep.  The episode lasted 1-1/2 hours and then resolved on insulin as he took his regular morning medication  Of note is that the patient does not have any history of ischemic heart disease. He states that he had a stress test at [facility] in the past which was normal. The patient is exercising regularly using a treadmill 3 times a week. He is watching his diet. His weight is up slightly but he just came back from a holiday cruise.  Past Medical History  Diagnosis Date  . Atrial fibrillation   . Abnormal blood test 02/24/2012  . Shingles     X3    Past Surgical History  Procedure Laterality Date  . Tonsillectomy    . Finger fracture surgery    . Knee arthroscopy Left      Current Outpatient Prescriptions  Medication Sig Dispense Refill  . aspirin 81 MG tablet Take 81 mg by mouth daily.    Marland Kitchen. diltiazem (CARTIA XT) 300 MG 24 hr capsule take 1 capsule by mouth once daily 90 capsule 3  . flecainide (TAMBOCOR) 100 MG tablet take 1 tablet by mouth every 12 hours 180 tablet 3  . fluticasone (FLONASE) 50 MCG/ACT  nasal spray Place 1 spray into both nostrils as needed (for allergies).     . Glucosamine-Chondroitin (GLUCOSAMINE CHONDR COMPLEX PO) Take by mouth daily.    . Omega-3 Fatty Acids (FISH OIL PO) Take 1 capsule by mouth daily.      No current facility-administered medications for this visit.    Allergies:   Penicillins    Social History:  The patient  reports that he has never smoked. He has never used smokeless tobacco. He reports that he does not drink alcohol or use illicit drugs.   Family History:  The patient's family history includes Cancer - Lung in his father; Cancer - Other in his sister; Diabetes in his mother; Heart disease in his mother.    ROS:  Please see the history of present illness.   Otherwise, review of systems are positive for none.   All other systems are reviewed and negative.    PHYSICAL EXAM: VS:  BP 118/80 mmHg  Pulse 62  Ht 5\' 11"  (1.803 m)  Wt 240 lb 12.8 oz (109.226 kg)  BMI 33.60 kg/m2 , BMI Body mass index is 33.6 kg/(m^2). GEN: Well nourished, well developed, in no acute distress HEENT: normal Neck: no JVD, carotid bruits, or masses Cardiac: RRR; no murmurs, rubs, or gallops,no edema  Respiratory:  clear to auscultation bilaterally, normal work of breathing  GI: soft, nontender, nondistended, + BS MS: no deformity or atrophy Skin: warm and dry, no rash Neuro:  Strength and sensation are intact Psych: euthymic mood, full affect   EKG:  EKG is ordered today. The ekg ordered today demonstrates sinus rhythm with first-degree AV block.  QTC interval is 450 ms.  Since prior tracing of 06/28/14, no significant change.   Recent Labs: No results found for requested labs within last 365 days.    Lipid Panel No results found for: CHOL, TRIG, HDL, CHOLHDL, VLDL, LDLCALC, LDLDIRECT    Wt Readings from Last 3 Encounters:  12/26/14 240 lb 12.8 oz (109.226 kg)  06/28/14 240 lb (108.863 kg)  12/29/13 237 lb (107.502 kg)         ASSESSMENT AND  PLAN:  1. Paroxysmal atrial fibrillation, suppressed on flecainide. Chadsvasc score of 0 2. GERD  Plan: Continue current medication. Recheck in 6 months for office visit.. Continue weight loss. Continue to avoid caffeine.   Current medicines are reviewed at length with the patient today.  The patient does not have concerns regarding medicines.  The following changes have been made:  no change  Labs/ tests ordered today include:   Orders Placed This Encounter  Procedures  . EKG 12-Lead     Signed, Cassell Clement MD 12/26/2014 11:41 AM    [facility] [address] [place], [place], [place]  [address] Phone: [phone]; Fax: [phone]

## 2014-12-26 NOTE — Patient Instructions (Addendum)
Medication Instructions:  Your physician recommends that you continue on your current medications as directed. Please refer to the Current Medication list given to you today.  Labwork: NONE  Testing/Procedures: NONE  Follow-Up: Your physician wants you to follow-up in: 6 MONTH OV You will receive a reminder letter in the mail two months in advance. If you don't receive a letter, please call our office to schedule the follow-up appointment.  WORK HARDER ON DIET EXERCISE

## 2015-06-19 ENCOUNTER — Ambulatory Visit: Payer: BLUE CROSS/BLUE SHIELD | Admitting: Cardiology

## 2015-08-09 ENCOUNTER — Encounter: Payer: Self-pay | Admitting: Cardiology

## 2015-08-09 ENCOUNTER — Ambulatory Visit (INDEPENDENT_AMBULATORY_CARE_PROVIDER_SITE_OTHER): Payer: 59 | Admitting: Cardiology

## 2015-08-09 VITALS — BP 118/70 | HR 66 | Ht 71.0 in | Wt 241.6 lb

## 2015-08-09 DIAGNOSIS — I480 Paroxysmal atrial fibrillation: Secondary | ICD-10-CM | POA: Diagnosis not present

## 2015-08-09 DIAGNOSIS — K219 Gastro-esophageal reflux disease without esophagitis: Secondary | ICD-10-CM | POA: Diagnosis not present

## 2015-08-09 NOTE — Patient Instructions (Signed)
Medication Instructions:  Your physician recommends that you continue on your current medications as directed. Please refer to the Current Medication list given to you today.  Labwork: none  Testing/Procedures: none  Follow-Up: Your physician wants you to follow-up in: 6 month ov with Dr Sherlyn Lick will receive a reminder letter in the mail two months in advance. If you don't receive a letter, please call our office to schedule the follow-up appointment.  If you need a refill on your cardiac medications before your next appointment, please call your pharmacy.

## 2015-08-09 NOTE — Progress Notes (Signed)
Cardiology Office Note   Date:  08/09/2015   ID:  Cesar Wheeler, DOB Jan 05, 1956, MRN [id number]  PCP:  Gweneth Dimitri, MD  Cardiologist: Cassell Clement MD  Chief Complaint  Patient presents with  . Follow-up    Patient denies any chest pain, shortness of breath, or le edema      History of Present Illness: Cesar Wheeler is a 60 y.o. male who presents for a six-month follow-up visit  This pleasant 60 year old gentleman is seen for a scheduled office visit. He has a past history of paroxysmal atrial fibrillation. He does not have any history of congestive heart failure, diabetes, high blood pressure, or previous stroke. His Chadss score is 0. He is on daily aspirin. His echocardiogram 02/25/12 showed an ejection fraction of 55-65% with mild mitral regurgitation and his atrial size was at the upper limit of normal. The patient is doing well on flecainide . Since last visit he had one episode of atrial fibrillation early in the morning of October 20 2014 after awakening from sleep. The episode lasted 1-1/2 hours and then resolved on insulin as he took his regular morning medication.  Since then he has had no further episodes.  Of note is that the patient does not have any history of ischemic heart disease. He states that he had a stress test at [facility] in the past which was normal. The patient is exercising regularly using a treadmill 3 times a week. He is watching his diet. His weight is up slightly but he just came back from a holiday cruise.  He will be going on another cruise in 2 weeks and another cruise next Thanksgiving.  He will be doing some shallow scuba diving next Thanksgiving and this will be okay.  He is previously certified. In December 2016 the patient underwent successful left inguinal herniorrhaphy surgery by Dr. Gerrit Friends.  He gained back the weight that he lost during the postoperative period when he was unable to exercise   Past Medical History  Diagnosis Date  . Atrial  fibrillation (HCC)   . Abnormal blood test 02/24/2012  . Shingles     X3    Past Surgical History  Procedure Laterality Date  . Tonsillectomy    . Finger fracture surgery    . Knee arthroscopy Left      Current Outpatient Prescriptions  Medication Sig Dispense Refill  . aspirin 81 MG tablet Take 81 mg by mouth daily.    Marland Kitchen diltiazem (CARTIA XT) 300 MG 24 hr capsule take 1 capsule by mouth once daily 90 capsule 3  . flecainide (TAMBOCOR) 100 MG tablet take 1 tablet by mouth every 12 hours 180 tablet 3  . fluticasone (FLONASE) 50 MCG/ACT nasal spray Place 1 spray into both nostrils as needed (for allergies).     . Glucosamine-Chondroitin (GLUCOSAMINE CHONDR COMPLEX PO) Take 1 tablet by mouth daily.     . Omega-3 Fatty Acids (FISH OIL PO) Take 1 capsule by mouth daily.      No current facility-administered medications for this visit.    Allergies:   Penicillins    Social History:  The patient  reports that he has never smoked. He has never used smokeless tobacco. He reports that he does not drink alcohol or use illicit drugs.   Family History:  The patient's family history includes Cancer - Lung in his father; Cancer - Other in his sister; Diabetes in his mother; Heart disease in his mother.    ROS:  Please see the history of present illness.   Otherwise, review of systems are positive for none.   All other systems are reviewed and negative.    PHYSICAL EXAM: VS:  BP 118/70 mmHg  Pulse 66  Ht  (1.803 m)  Wt 241 lb 9.6 oz (109.589 kg)  BMI 33.71 kg/m2 , BMI Body mass index is 33.71 kg/(m^2). GEN: Well nourished, well developed, in no acute distress HEENT: normal Neck: no JVD, carotid bruits, or masses Cardiac: RRR; no murmurs, rubs, or gallops,no edema  Respiratory:  clear to auscultation bilaterally, normal work of breathing GI: soft, nontender, nondistended, + BS MS: no deformity or atrophy Skin: warm and dry, no rash Neuro:  Strength and sensation are  intact Psych: euthymic mood, full affect   EKG:  EKG is ordered today. The ekg ordered today demonstrates normal sinus rhythm with first-degree AV block.  Left axis deviation.  Since prior tracing of 12/26/15, no significant change   Recent Labs: No results found for requested labs within last 365 days.    Lipid Panel No results found for: CHOL, TRIG, HDL, CHOLHDL, VLDL, LDLCALC, LDLDIRECT    Wt Readings from Last 3 Encounters:  08/09/15 241 lb 9.6 oz (109.589 kg)  12/26/14 240 lb 12.8 oz (109.226 kg)  06/28/14 240 lb (108.863 kg)         ASSESSMENT AND PLAN:  1. Paroxysmal atrial fibrillation, suppressed on flecainide. Chadsvasc score of 0 2. GERD   Current medicines are reviewed at length with the patient today.  The patient does not have concerns regarding medicines.  The following changes have been made:  no change  Labs/ tests ordered today include:   Orders Placed This Encounter  Procedures  . EKG 12-Lead    Disposition: Continue current medication.  Continue to try to lose weight.  He is not having any flareup of his GERD.  He will return in 6 months for follow-up office visit with Dr. Mayford Knife   Signed, Cassell Clement MD 08/09/2015 9:08 AM    [facility] [address] [place], [place], [place]  [address] Phone: [phone]; Fax: [phone]

## 2015-11-29 ENCOUNTER — Other Ambulatory Visit: Payer: Self-pay | Admitting: Family Medicine

## 2015-11-29 DIAGNOSIS — R1901 Right upper quadrant abdominal swelling, mass and lump: Secondary | ICD-10-CM

## 2015-12-07 ENCOUNTER — Ambulatory Visit
Admission: RE | Admit: 2015-12-07 | Discharge: 2015-12-07 | Disposition: A | Discharge status: Home / Self Care | Payer: 59 | Source: Ambulatory Visit | Attending: Family Medicine | Admitting: Family Medicine

## 2015-12-07 DIAGNOSIS — R1901 Right upper quadrant abdominal swelling, mass and lump: Secondary | ICD-10-CM

## 2015-12-07 IMAGING — US US ABDOMEN COMPLETE
1 series · 13 of 25 positions shown · non-contrast
Comparison: None in PACs

CLINICAL DATA: Right upper quadrant abdominal mass

EXAM:
ABDOMEN ULTRASOUND COMPLETE

[Series 1: us abdomen complete · 0.32mm/px · 13 of 91 slices shown]
[im 1/91]
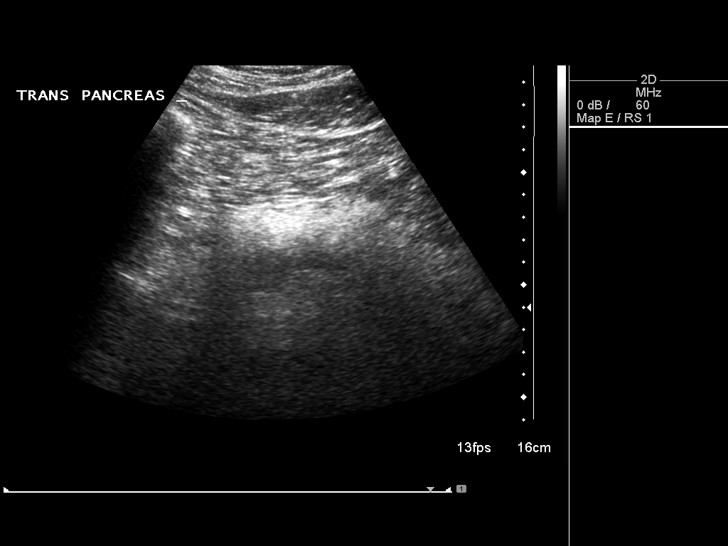
[im 8/91]
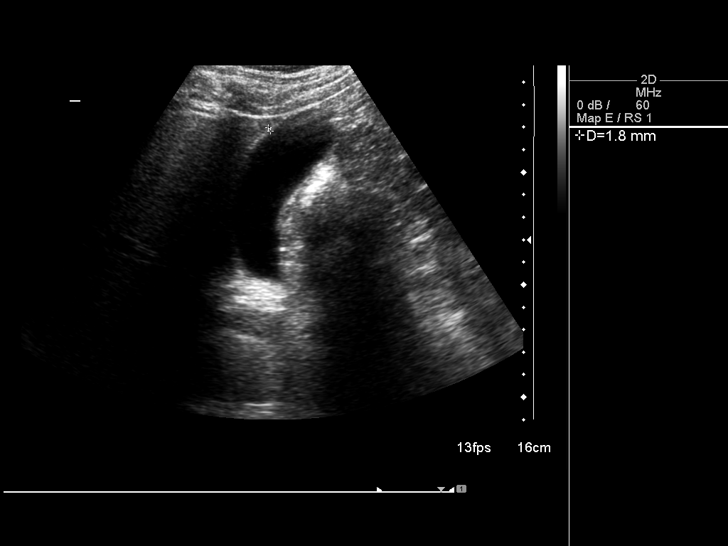
[im 16/91]
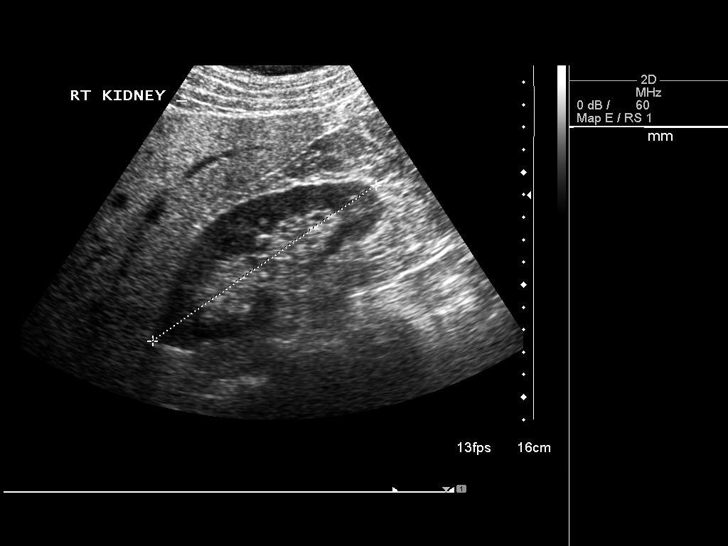
[im 23/91]
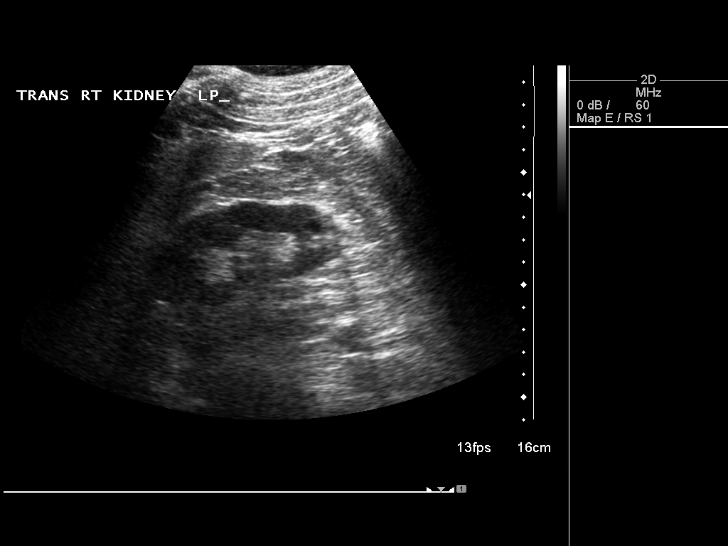
[im 31/91]
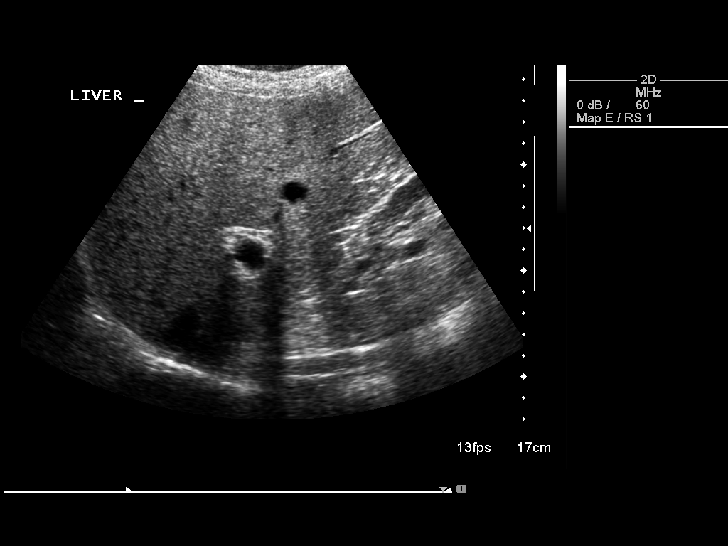
[im 38/91]
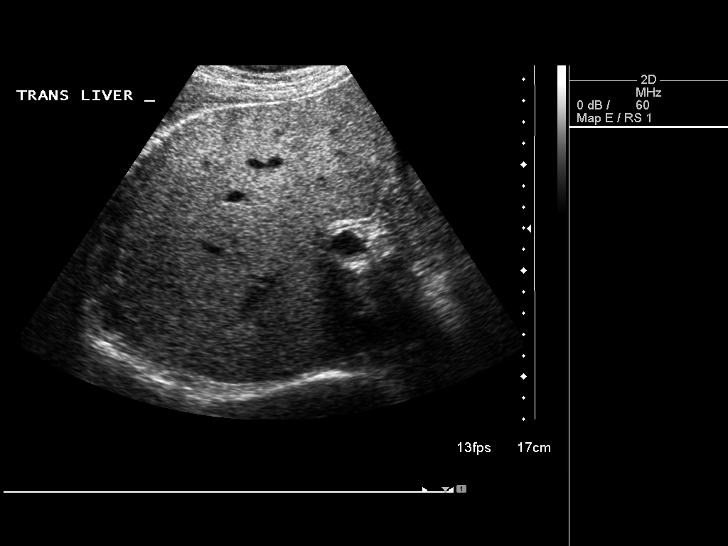
[im 46/91]
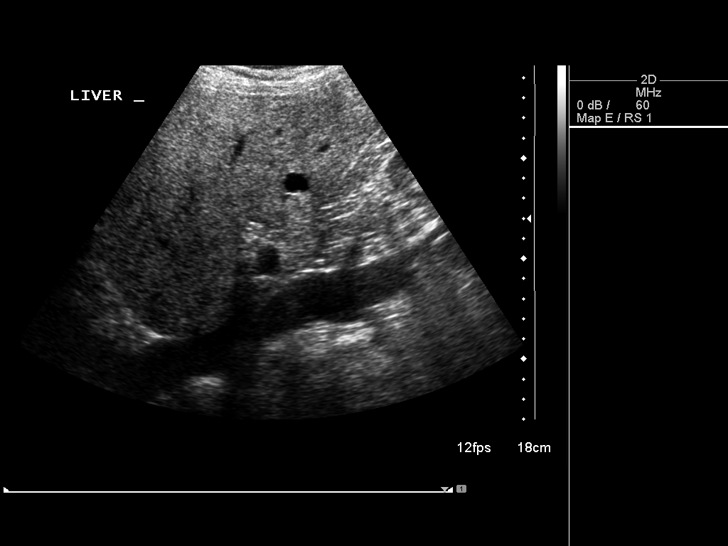
[im 53/91]
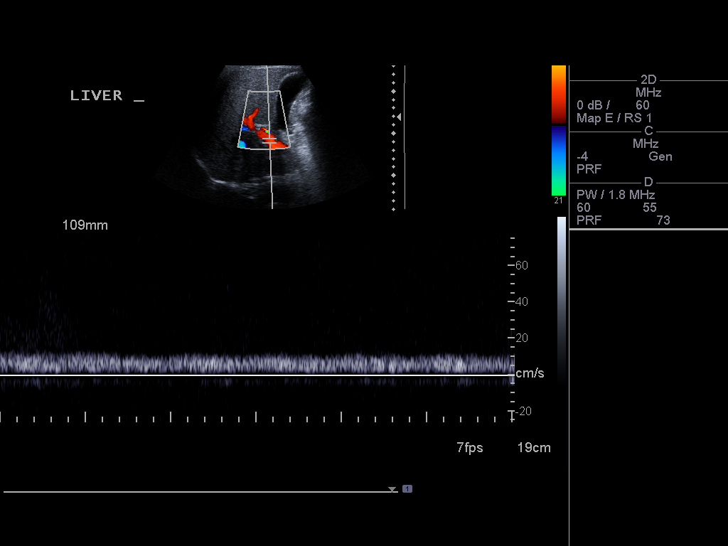
[im 61/91]
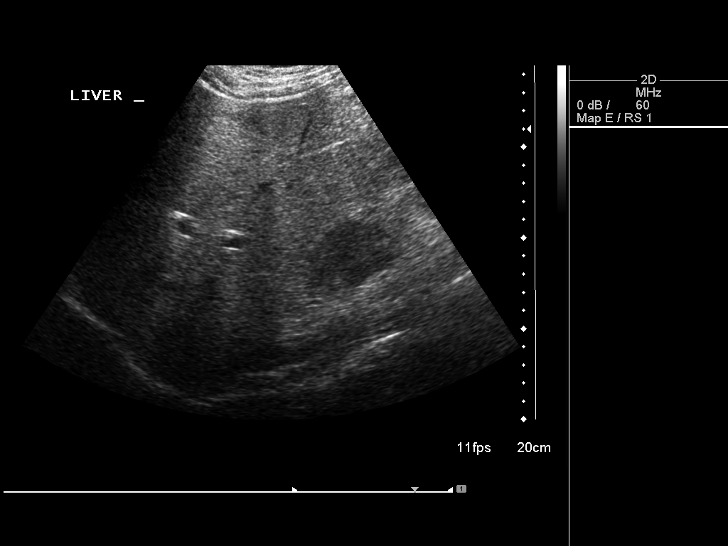
[im 68/91]
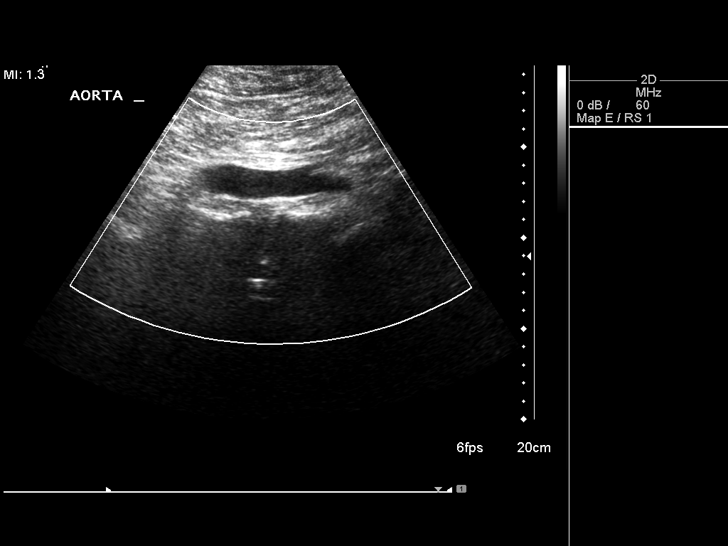
[im 76/91]
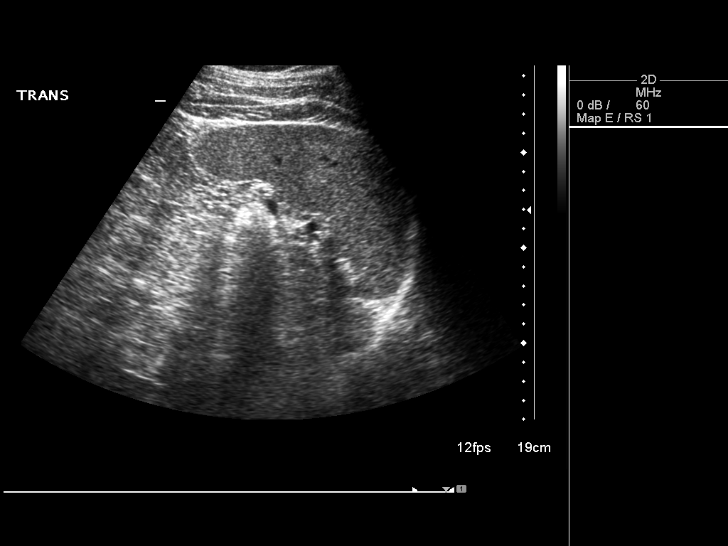
[im 83/91]
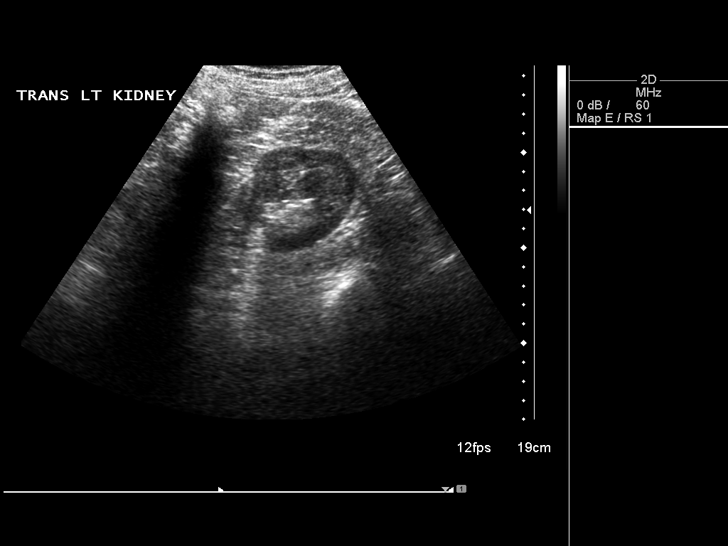
[im 91/91]
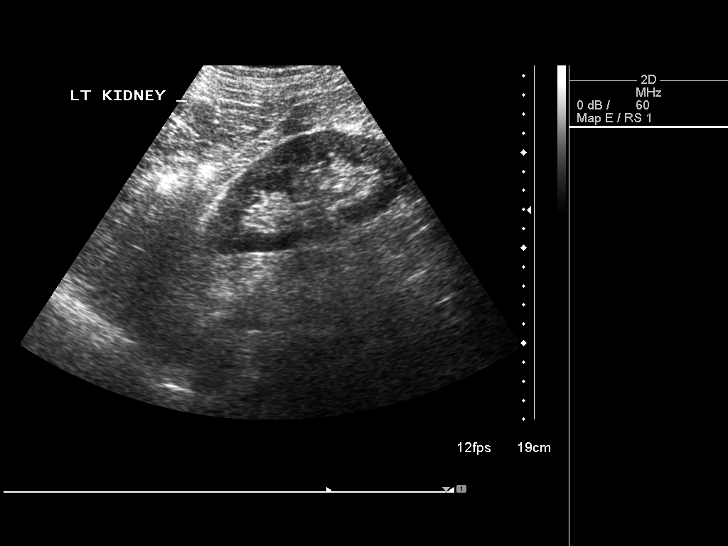

[13 of 25 positions shown; findings below may reference images not displayed]

FINDINGS: Gallbladder: No gallstones or wall thickening visualized. No
sonographic Murphy sign noted by sonographer.

Common bile duct: Diameter: 6 mm

Liver: The liver exhibits mildly increased echotexture. There is
probable focal fatty sparing in the right lobe of the liver. There
is a right lobe simple appearing cyst which measures 1.4 cm in
diameter. There is no solid mass nor intrahepatic ductal dilation.

IVC: No abnormality visualized.

Pancreas: The pancreatic head and tail were obscured by bowel gas.
The pancreatic body is normal in appearance.

Spleen: Size and appearance within normal limits.

Right Kidney: Length: 12.1 cm. The renal cortical echotexture is
normal. There is no hydronephrosis. There is a lower pole cyst
measuring 9 mm in diameter.

Left Kidney: Length: 12 cm. The renal cortical echotexture is
normal. In the upper pole there is a 6 mm diameter cortical cyst.

Abdominal aorta: No aneurysm visualized.

Other findings: There is no ascites.
IMPRESSION: 1. No intra-abdominal mass is observed on this study. If there is a
clinically palpable mass, abdominal and pelvic CT scanning would be
a useful next imaging step.
2. Fatty infiltrative change of the liver with probable focal fatty
sparing. There is a small simple appearing hepatic cyst. Further
evaluation of the abdomen with hepatic protocol CT scanning or MRI
would be useful to exclude other etiologies of the assumed focal
fatty sparing in the right hepatic lobe.
3. Limited visualization of the pancreas. Simple appearing cysts in
both kidneys.

## 2016-01-15 ENCOUNTER — Other Ambulatory Visit: Payer: Self-pay | Admitting: *Deleted

## 2016-01-15 DIAGNOSIS — I480 Paroxysmal atrial fibrillation: Secondary | ICD-10-CM

## 2016-01-15 MED ORDER — FLECAINIDE ACETATE 100 MG PO TABS
ORAL_TABLET | ORAL | Status: DC
Start: 2016-01-15 — End: 2016-07-23

## 2016-01-15 MED ORDER — DILTIAZEM HCL ER COATED BEADS 300 MG PO CP24: ORAL_CAPSULE | ORAL | Status: DC

## 2016-02-06 ENCOUNTER — Ambulatory Visit (INDEPENDENT_AMBULATORY_CARE_PROVIDER_SITE_OTHER): Payer: 59 | Admitting: Cardiology

## 2016-02-06 ENCOUNTER — Encounter: Payer: Self-pay | Admitting: Cardiology

## 2016-02-06 VITALS — BP 124/70 | HR 68 | Ht 71.0 in | Wt 244.4 lb

## 2016-02-06 DIAGNOSIS — I480 Paroxysmal atrial fibrillation: Secondary | ICD-10-CM

## 2016-02-06 NOTE — Patient Instructions (Signed)

## 2016-02-06 NOTE — Progress Notes (Signed)
Cardiology Office Note    Date:  02/06/2016   ID:  Cesar Wheeler, DOB Nov 26, 1955, MRN [id number]  PCP:  Gweneth Dimitri, MD  Cardiologist:  Armanda Magic, MD   Chief Complaint  Patient presents with  . Atrial Fibrillation    History of Present Illness:  Cesar Wheeler is a 60 y.o. male with a past history of paroxysmal atrial fibrillation. He does not have any history of congestive heart failure, diabetes, high blood pressure, or previous stroke. His Chads2VASC score is 0. He is on daily aspirin. His echocardiogram 02/25/12 showed an ejection fraction of 55-65% with mild mitral regurgitation and his atrial size was at the upper limit of normal. The patient is doing well on flecainide.  He denies any chest pain, SOB, DOE (except with weight gain), LE edema, dizziness or syncope.  He had an episode of PAF one night a few weeks ago after starting high dose simvastatin.  This lasted about 2-3 hours and took an extra flecainide and it resolved.  His simvastatin was stopped and he was placed on Lipitor.  He has not had any further problems with PAF.     Past Medical History  Diagnosis Date  . Atrial fibrillation (HCC)   . Abnormal blood test 02/24/2012  . Shingles     X3    Past Surgical History  Procedure Laterality Date  . Tonsillectomy    . Finger fracture surgery    . Knee arthroscopy Left     Current Medications: Outpatient Prescriptions Prior to Visit  Medication Sig Dispense Refill  . aspirin 81 MG tablet Take 81 mg by mouth daily.    Marland Kitchen diltiazem (CARTIA XT) 300 MG 24 hr capsule take 1 capsule by mouth once daily 90 capsule 1  . flecainide (TAMBOCOR) 100 MG tablet take 1 tablet by mouth every 12 hours 180 tablet 1  . fluticasone (FLONASE) 50 MCG/ACT nasal spray Place 1 spray into both nostrils as needed (for allergies).     . Glucosamine-Chondroitin (GLUCOSAMINE CHONDR COMPLEX PO) Take 1 tablet by mouth daily.     . Omega-3 Fatty Acids (FISH OIL PO) Take 1 capsule by mouth daily.       No facility-administered medications prior to visit.     Allergies:   Penicillins   Social History   Social History  . Marital Status: Single    Spouse Name: N/A  . Number of Children: N/A  . Years of Education: N/A   Occupational History  .     Social History Main Topics  . Smoking status: Never Smoker   . Smokeless tobacco: Never Used  . Alcohol Use: No  . Drug Use: No  . Sexual Activity: Not Asked   Other Topics Concern  . None   Social History Narrative     Family History:  The patient's family history includes Cancer - Lung in his father; Cancer - Other in his sister; Diabetes in his mother; Heart disease in his mother.   ROS:   Please see the history of present illness.    ROS All other systems reviewed and are negative.   PHYSICAL EXAM:   VS:  BP 124/70 mmHg  Pulse 68  Ht  (1.803 m)  Wt 244 lb 6.4 oz (110.859 kg)  BMI 34.10 kg/m2  SpO2 97%   GEN: Well nourished, well developed, in no acute distress HEENT: normal Neck: no JVD, carotid bruits, or masses Cardiac: RRR; no murmurs, rubs, or gallops,no edema.  Intact  distal pulses bilaterally.  Respiratory:  clear to auscultation bilaterally, normal work of breathing GI: soft, nontender, nondistended, + BS MS: no deformity or atrophy Skin: warm and dry, no rash Neuro:  Alert and Oriented x 3, Strength and sensation are intact Psych: euthymic mood, full affect  Wt Readings from Last 3 Encounters:  02/06/16 244 lb 6.4 oz (110.859 kg)  08/09/15 241 lb 9.6 oz (109.589 kg)  12/26/14 240 lb 12.8 oz (109.226 kg)      Studies/Labs Reviewed:   EKG:  EKG is ordered today and showed NSR first degree AV block.  QRS 108msec (104msec last EKG). Recent Labs: No results found for requested labs within last 365 days.   Lipid Panel No results found for: CHOL, TRIG, HDL, CHOLHDL, VLDL, LDLCALC, LDLDIRECT  Additional studies/ records that were reviewed today include:  none    ASSESSMENT:    1.  PAF (paroxysmal atrial fibrillation) (HCC)      PLAN:  In order of problems listed above:  1. PAF maintaining NSR - continue Cardizem, Flecainide and ASA (CHADS2VASC score 1).      Medication Adjustments/Labs and Tests Ordered: Current medicines are reviewed at length with the patient today.  Concerns regarding medicines are outlined above.  Medication changes, Labs and Tests ordered today are listed in the Patient Instructions below.  There are no Patient Instructions on file for this visit.   Signed, Armanda Magicraci Turner, MD  02/06/2016 9:20 AM    [facility] [address], [place], [place]  [address] Phone: [phone](336) [phone]; Fax: [phone]) [phone]

## 2016-07-23 ENCOUNTER — Other Ambulatory Visit: Payer: Self-pay | Admitting: Cardiology

## 2016-07-23 DIAGNOSIS — I480 Paroxysmal atrial fibrillation: Secondary | ICD-10-CM

## 2016-08-05 NOTE — Progress Notes (Signed)
Cardiology Office Note    Date:  08/06/2016   ID:  AUSTEN OYSTER, DOB Aug 15, 1955, MRN [id number]  PCP:  Gweneth Dimitri, MD  Cardiologist:  Armanda Magic, MD   Chief Complaint  Patient presents with  . Atrial Fibrillation    History of Present Illness:  Cesar Wheeler is a 61 y.o. male with a past history of persistent atrial fibrillation with a Chads2VASC score of 0. He is on daily aspirin. His echocardiogram 02/25/12 showed an ejection fraction of 55-65% with mild mitral regurgitation and his atrial size was at the upper limit of normal. He also has a history of dyslipidemia and is on Lipitor which he tolerates well.  He presents today for followup.  He is doing well on flecainide.  He denies any chest pain, SOB, DOE (except with weight gain), LE edema, dizziness or syncope. Unfortunately he thinks he has a kidney stone and is going to see his PCP.  Past Medical History:  Diagnosis Date  . Abnormal blood test 02/24/2012  . Atrial fibrillation (HCC)   . Shingles    X3    Past Surgical History:  Procedure Laterality Date  . FINGER FRACTURE SURGERY    . KNEE ARTHROSCOPY Left   . TONSILLECTOMY      Current Medications: Outpatient Medications Prior to Visit  Medication Sig Dispense Refill  . aspirin 81 MG tablet Take 81 mg by mouth daily.    Marland Kitchen atorvastatin (LIPITOR) 10 MG tablet Take 10 mg by mouth daily.  0  . CARTIA XT 300 MG 24 hr capsule take 1 capsule by mouth once daily 90 capsule 1  . flecainide (TAMBOCOR) 100 MG tablet take 1 tablet by mouth every 12 hours 180 tablet 1  . fluticasone (FLONASE) 50 MCG/ACT nasal spray Place 1 spray into both nostrils as needed (for allergies).     . Glucosamine-Chondroitin (GLUCOSAMINE CHONDR COMPLEX PO) Take 1 tablet by mouth daily.     . Omega-3 Fatty Acids (FISH OIL PO) Take 1 capsule by mouth daily.      No facility-administered medications prior to visit.      Allergies:   Penicillins   Social History   Social History  . Marital  status: Single    Spouse name: N/A  . Number of children: N/A  . Years of education: N/A   Occupational History  .  [employer]   Social History Main Topics  . Smoking status: Never Smoker  . Smokeless tobacco: Never Used  . Alcohol use No  . Drug use: No  . Sexual activity: Not Asked   Other Topics Concern  . None   Social History Narrative  . None     Family History:  The patient's family history includes Cancer - Lung in his father; Cancer - Other in his sister; Diabetes in his mother; Heart disease in his mother.   ROS:   Please see the history of present illness.    ROS All other systems reviewed and are negative.  No flowsheet data found.     PHYSICAL EXAM:   VS:  BP 132/82   Pulse 64   Ht 5\' 11"  (1.803 m)   Wt 247 lb (112 kg)   SpO2 98%   BMI 34.45 kg/m    GEN: Well nourished, well developed, in no acute distress  HEENT: normal  Neck: no JVD, carotid bruits, or masses Cardiac: RRR; no murmurs, rubs, or gallops,no edema.  Intact distal pulses bilaterally.  Respiratory:  clear to auscultation bilaterally, normal work of breathing GI: soft, nontender, nondistended, + BS MS: no deformity or atrophy  Skin: warm and dry, no rash Neuro:  Alert and Oriented x 3, Strength and sensation are intact Psych: euthymic mood, full affect  Wt Readings from Last 3 Encounters:  08/06/16 247 lb (112 kg)  02/06/16 244 lb 6.4 oz (110.9 kg)  08/09/15 241 lb 9.6 oz (109.6 kg)      Studies/Labs Reviewed:   EKG:  EKG is not ordered today.    Recent Labs: No results found for requested labs within last 8760 hours.   Lipid Panel No results found for: CHOL, TRIG, HDL, CHOLHDL, VLDL, LDLCALC, LDLDIRECT  Additional studies/ records that were reviewed today include:  none    ASSESSMENT:    1. Persistent atrial fibrillation (HCC)      PLAN:  In order of problems listed above:  1. Persistent atrial fibrillation - he is maintaining NSR.  He will  continue ASA as his CHADS2VASC score is 0.  He will continue on Cardizem and Flecainide.       Medication Adjustments/Labs and Tests Ordered: Current medicines are reviewed at length with the patient today.  Concerns regarding medicines are outlined above.  Medication changes, Labs and Tests ordered today are listed in the Patient Instructions below.  There are no Patient Instructions on file for this visit.   Signed, Armanda Magicraci Ronne Stefanski, MD  08/06/2016 10:24 AM    [facility] [address], [place], [place]  [address] Phone: [phone](336) [phone]; Fax: [phone](336) [phone]

## 2016-08-06 ENCOUNTER — Encounter: Payer: Self-pay | Admitting: Cardiology

## 2016-08-06 ENCOUNTER — Ambulatory Visit (INDEPENDENT_AMBULATORY_CARE_PROVIDER_SITE_OTHER): Payer: 59 | Admitting: Cardiology

## 2016-08-06 VITALS — BP 132/82 | HR 64 | Ht 71.0 in | Wt 247.0 lb

## 2016-08-06 DIAGNOSIS — I4819 Other persistent atrial fibrillation: Secondary | ICD-10-CM

## 2016-08-06 DIAGNOSIS — I481 Persistent atrial fibrillation: Secondary | ICD-10-CM

## 2016-08-06 NOTE — Patient Instructions (Signed)
Medication Instructions:  Your physician recommends that you continue on your current medications as directed. Please refer to the Current Medication list given to you today.   Labwork: None  Testing/Procedures: None  Follow-Up: You have an appointment scheduled with Dr. Zachery DauerBarnes TODAY at 4:00PM. Please call your PCP if you need to reschedule this appointment.  Your physician wants you to follow-up in: 1 year with Dr. Mayford Knifeurner. You will receive a reminder letter in the mail two months in advance. If you don't receive a letter, please call our office to schedule the follow-up appointment.   Any Other Special Instructions Will Be Listed Below (If Applicable).     If you need a refill on your cardiac medications before your next appointment, please call your pharmacy.

## 2017-01-14 ENCOUNTER — Other Ambulatory Visit: Payer: Self-pay | Admitting: Cardiology

## 2017-01-14 DIAGNOSIS — I480 Paroxysmal atrial fibrillation: Secondary | ICD-10-CM

## 2017-07-03 ENCOUNTER — Telehealth: Payer: Self-pay | Admitting: Cardiology

## 2017-07-03 DIAGNOSIS — I480 Paroxysmal atrial fibrillation: Secondary | ICD-10-CM

## 2017-07-03 MED ORDER — FLECAINIDE ACETATE 100 MG PO TABS: 100.0000 mg | ORAL_TABLET | Freq: Two times a day (BID) | ORAL | 0 refills | Status: DC

## 2017-07-03 MED ORDER — DILTIAZEM HCL ER COATED BEADS 300 MG PO CP24: 300.0000 mg | ORAL_CAPSULE | Freq: Every day | ORAL | 0 refills | Status: DC

## 2017-07-03 NOTE — Telephone Encounter (Signed)
Pt's medications were sent to pt's pharmacy as requested. Confirmation received.  

## 2017-07-03 NOTE — Telephone Encounter (Signed)
°*  STAT* If patient is at the pharmacy, call can be transferred to refill team.   1. Which medications need to be refilled? (please list name of each medication and dose if known) Cartia 300 mg and Flecainide 100 mg  2. Which pharmacy/location (including street and city if local pharmacy) is medication to be sent to? [id number] - [place], [place] - [address]  3. Do they need a 30 day or 90 day supply? 90

## 2017-09-08 NOTE — Progress Notes (Signed)
Cardiology Office Note:    Date:  09/09/2017   ID:  Cesar Wheeler, DOB Dec 04, 1955, MRN [id number]  PCP:  Gweneth DimitriMcNeill, Wendy, MD  Cardiologist:  No primary care provider on file.    Referring MD: Gweneth DimitriMcNeill, Wendy, MD   Chief Complaint  Patient presents with  . Atrial Fibrillation    History of Present Illness:    Cesar Wheeler is a 62 y.o. male with a hx of persistent atrial fibrillation with a Chads2VASC score of 0.  His echocardiogram 02/25/12 showed an ejection fraction of 55-65% with mild mitral regurgitation and his atrial size was at the upper limit of normal. He also has a history of dyslipidemia and is on Lipitor which he tolerates well.    He presents today for followup. He denies any chest pain, SOB, DOE (except with weight gain), LE edema, dizziness or syncope. Unfortunately he thinks he has a kidney stone and is going to see his PCP.  Past Medical History:  Diagnosis Date  . Abnormal blood test 02/24/2012  . Atrial fibrillation (HCC)   . Shingles    X3    Past Surgical History:  Procedure Laterality Date  . FINGER FRACTURE SURGERY    . KNEE ARTHROSCOPY Left   . TONSILLECTOMY      Current Medications: Current Meds  Medication Sig  . aspirin 81 MG tablet Take 81 mg by mouth daily.  Marland Kitchen. atorvastatin (LIPITOR) 10 MG tablet Take 10 mg by mouth daily.  Marland Kitchen. diltiazem (CARTIA XT) 300 MG 24 hr capsule Take 1 capsule (300 mg total) by mouth daily. Please keep upcoming appt in February for future refills. Thank you  . flecainide (TAMBOCOR) 100 MG tablet Take 1 tablet (100 mg total) by mouth every 12 (twelve) hours. Please keep upcoming appt in February for future refills. Thank you  . fluticasone (FLONASE) 50 MCG/ACT nasal spray Place 1 spray into both nostrils as needed (for allergies).   . Glucosamine-Chondroitin (GLUCOSAMINE CHONDR COMPLEX PO) Take 1 tablet by mouth daily.   . Omega-3 Fatty Acids (FISH OIL PO) Take 1 capsule by mouth daily.      Allergies:   Penicillins    Social History   Socioeconomic History  . Marital status: Single    Spouse name: None  . Number of children: None  . Years of education: None  . Highest education level: None  Social Needs  . Financial resource strain: None  . Food insecurity - worry: None  . Food insecurity - inability: None  . Transportation needs - medical: None  . Transportation needs - non-medical: None  Occupational History    Employer: [employer]  Tobacco Use  . Smoking status: Never Smoker  . Smokeless tobacco: Never Used  Substance and Sexual Activity  . Alcohol use: No  . Drug use: No  . Sexual activity: None  Other Topics Concern  . None  Social History Narrative  . None     Family History: The patient's family history includes Cancer - Lung in his father; Cancer - Other in his sister; Diabetes in his mother; Heart disease in his mother.  ROS:   Please see the history of present illness.    ROS  All other systems reviewed and negative.   EKGs/Labs/Other Studies Reviewed:    The following studies were reviewed today: none  EKG:  EKG is  ordered today and showed NSR at 62bpm with LAD, LVH by voltage and anterior infarct  Recent Labs: No results found  for requested labs within last 8760 hours.   Recent Lipid Panel No results found for: CHOL, TRIG, HDL, CHOLHDL, VLDL, LDLCALC, LDLDIRECT  Physical Exam:    VS:  BP 122/76   Pulse 62   Ht 5\' 11"  (1.803 m)   Wt 248 lb (112.5 kg)   BMI 34.59 kg/m     Wt Readings from Last 3 Encounters:  09/09/17 248 lb (112.5 kg)  08/06/16 247 lb (112 kg)  02/06/16 244 lb 6.4 oz (110.9 kg)     GEN:  Well nourished, well developed in no acute distress HEENT: Normal NECK: No JVD; No carotid bruits LYMPHATICS: No lymphadenopathy CARDIAC: RRR, no murmurs, rubs, gallops RESPIRATORY:  Clear to auscultation without rales, wheezing or rhonchi  ABDOMEN: Soft, non-tender, non-distended MUSCULOSKELETAL:  No edema; No deformity  SKIN:  Warm and dry NEUROLOGIC:  Alert and oriented x 3 PSYCHIATRIC:  Normal affect   ASSESSMENT:    1. Persistent atrial fibrillation (HCC)    PLAN:    In order of problems listed above:  1.  Persistent atrial fibrillation - he is maintaining NSR on exam today.  He will continue on Cardizem XT 300mg  daily and Flecainide 100mg  BID.  His CHADS2VASC score is 0.  He does snore at night.  I have recommended a home sleep study to rule out OSA that could trigger afib.   Medication Adjustments/Labs and Tests Ordered: Current medicines are reviewed at length with the patient today.  Concerns regarding medicines are outlined above.  No orders of the defined types were placed in this encounter.  No orders of the defined types were placed in this encounter.   Signed, Armanda Magic, MD  09/09/2017 9:21 AM     [facility]

## 2017-09-09 ENCOUNTER — Encounter: Payer: Self-pay | Admitting: Cardiology

## 2017-09-09 ENCOUNTER — Ambulatory Visit: Payer: 59 | Admitting: Cardiology

## 2017-09-09 VITALS — BP 122/76 | HR 62 | Ht 71.0 in | Wt 248.0 lb

## 2017-09-09 DIAGNOSIS — I4819 Other persistent atrial fibrillation: Secondary | ICD-10-CM

## 2017-09-09 DIAGNOSIS — I481 Persistent atrial fibrillation: Secondary | ICD-10-CM

## 2017-09-09 DIAGNOSIS — I480 Paroxysmal atrial fibrillation: Secondary | ICD-10-CM

## 2017-09-09 MED ORDER — FLECAINIDE ACETATE 100 MG PO TABS: 100.0000 mg | ORAL_TABLET | Freq: Two times a day (BID) | ORAL | 3 refills | Status: DC

## 2017-09-09 MED ORDER — DILTIAZEM HCL ER COATED BEADS 300 MG PO CP24: 300.0000 mg | ORAL_CAPSULE | Freq: Every day | ORAL | 3 refills | Status: DC

## 2017-09-09 NOTE — Patient Instructions (Signed)
Medication Instructions:  Your provider recommends that you continue on your current medications as directed. Please refer to the Current Medication list given to you today.    Labwork: None  Testing/Procedures: Dr. Mayford Knifeurner recommends you have a HOME SLEEP TEST. You will be called to arrange the study after paperwork is complete.  Follow-Up: Your provider wants you to follow-up in: 1 year with Dr. Mayford Knifeurner. You will receive a reminder letter in the mail two months in advance. If you don't receive a letter, please call our office to schedule the follow-up appointment.    Any Other Special Instructions Will Be Listed Below (If Applicable).     If you need a refill on your cardiac medications before your next appointment, please call your pharmacy.

## 2017-09-12 ENCOUNTER — Telehealth: Payer: Self-pay | Admitting: *Deleted

## 2017-09-12 NOTE — Telephone Encounter (Signed)
Informed patient of upcoming home sleep study and patient understanding was verbalized. Patient understands his sleep study will be done at HOME. Patient understands he will receive a CALL in a week or so. Patient understands to call if he does not receive that CALL in a timely manner. Patient agrees with treatment and thanked me for call. 

## 2017-10-09 ENCOUNTER — Telehealth: Payer: Self-pay | Admitting: *Deleted

## 2017-10-09 NOTE — Telephone Encounter (Signed)
Recommend home sleep study when cold and sinus issues are resolved

## 2017-10-09 NOTE — Telephone Encounter (Signed)
-----   Message from Quintella Reichertraci R Turner, MD sent at 09/30/2017  8:56 PM EDT ----- Patient has mild OSA - please set up in lab CPAP titration

## 2017-10-09 NOTE — Telephone Encounter (Signed)
Informed patient of sleep study results and patient understanding was verbalized. Patient understands he quit breathing 11.8 times an hour. Patient understands he has mild sleep apnea and Dr Mayford Knifeurner has recommended him for an in lab CPAP titration. Patient states he had a sinus infection during his testing which caused him some breathing difficulties and thinks that would have interfered with his results.  Patient does not want to have the titration for this reason. Please advise.

## 2017-10-14 NOTE — Telephone Encounter (Signed)
Called patient to discuss his question for doctor Turner lmtcb.

## 2017-10-16 NOTE — Telephone Encounter (Signed)
Called patient to discuss his question for doctor Turner lmtcb. 

## 2017-10-24 NOTE — Telephone Encounter (Signed)
Called patient to see if he wanted to retest. He states at this time he is still congested with allegries and will call when he is ready to retest.

## 2018-08-09 ENCOUNTER — Other Ambulatory Visit: Payer: Self-pay | Admitting: Physician Assistant

## 2018-08-09 MED ORDER — DILTIAZEM HCL ER COATED BEADS 360 MG PO CP24
360.0000 mg | ORAL_CAPSULE | Freq: Every day | ORAL | 11 refills | Status: DC
Start: 2018-08-09 — End: 2018-08-26

## 2018-08-10 ENCOUNTER — Encounter (HOSPITAL_COMMUNITY): Payer: Self-pay | Admitting: Nurse Practitioner

## 2018-08-10 ENCOUNTER — Ambulatory Visit (HOSPITAL_COMMUNITY)
Admission: RE | Admit: 2018-08-10 | Discharge: 2018-08-10 | Disposition: A | Discharge status: Home / Self Care | Payer: 59 | Source: Ambulatory Visit | Attending: Nurse Practitioner | Admitting: Nurse Practitioner

## 2018-08-10 ENCOUNTER — Telehealth: Payer: Self-pay | Admitting: Cardiology

## 2018-08-10 VITALS — BP 118/74 | HR 105 | Ht 71.0 in | Wt 241.0 lb

## 2018-08-10 DIAGNOSIS — Z6833 Body mass index (BMI) 33.0-33.9, adult: Secondary | ICD-10-CM | POA: Insufficient documentation

## 2018-08-10 DIAGNOSIS — R0683 Snoring: Secondary | ICD-10-CM | POA: Insufficient documentation

## 2018-08-10 DIAGNOSIS — R9431 Abnormal electrocardiogram [ECG] [EKG]: Secondary | ICD-10-CM | POA: Insufficient documentation

## 2018-08-10 DIAGNOSIS — Z833 Family history of diabetes mellitus: Secondary | ICD-10-CM | POA: Diagnosis not present

## 2018-08-10 DIAGNOSIS — I4819 Other persistent atrial fibrillation: Secondary | ICD-10-CM

## 2018-08-10 DIAGNOSIS — I4892 Unspecified atrial flutter: Secondary | ICD-10-CM | POA: Diagnosis not present

## 2018-08-10 DIAGNOSIS — E669 Obesity, unspecified: Secondary | ICD-10-CM | POA: Diagnosis not present

## 2018-08-10 DIAGNOSIS — Z880 Allergy status to penicillin: Secondary | ICD-10-CM | POA: Insufficient documentation

## 2018-08-10 DIAGNOSIS — I480 Paroxysmal atrial fibrillation: Secondary | ICD-10-CM

## 2018-08-10 DIAGNOSIS — Z7901 Long term (current) use of anticoagulants: Secondary | ICD-10-CM | POA: Diagnosis not present

## 2018-08-10 DIAGNOSIS — Z79899 Other long term (current) drug therapy: Secondary | ICD-10-CM | POA: Insufficient documentation

## 2018-08-10 DIAGNOSIS — Z8249 Family history of ischemic heart disease and other diseases of the circulatory system: Secondary | ICD-10-CM | POA: Insufficient documentation

## 2018-08-10 LAB — CBC
HCT: 50.1 % (ref 39.0–52.0)
Hemoglobin: 16.5 g/dL (ref 13.0–17.0)
MCH: 29.5 pg (ref 26.0–34.0)
MCHC: 32.9 g/dL (ref 30.0–36.0)
MCV: 89.6 fL (ref 80.0–100.0)
NRBC: 0 % (ref 0.0–0.2)
PLATELETS: 283 10*3/uL (ref 150–400)
RBC: 5.59 MIL/uL (ref 4.22–5.81)
RDW: 11.9 % (ref 11.5–15.5)
WBC: 9 10*3/uL (ref 4.0–10.5)

## 2018-08-10 LAB — BASIC METABOLIC PANEL
Anion gap: 11 (ref 5–15)
BUN: 14 mg/dL (ref 8–23)
CALCIUM: 9.2 mg/dL (ref 8.9–10.3)
CO2: 25 mmol/L (ref 22–32)
Chloride: 103 mmol/L (ref 98–111)
Creatinine, Ser: 1.22 mg/dL (ref 0.61–1.24)
GFR calc Af Amer: >60 mL/min (ref >60)
GLUCOSE: 99 mg/dL (ref 70–99)
Potassium: 4.3 mmol/L (ref 3.5–5.1)
SODIUM: 139 mmol/L (ref 135–145)

## 2018-08-10 MED ORDER — RIVAROXABAN 20 MG PO TABS: 20.0000 mg | ORAL_TABLET | Freq: Every day | ORAL | 0 refills | Status: DC

## 2018-08-10 NOTE — Patient Instructions (Signed)
Cardioversion scheduled for Wednesday, January 29th  - Arrive at the [facility] and go to admitting at [facility] not eat or drink anything after midnight the night prior to your procedure.  - Take all your morning medication with a sip of water prior to arrival.  - You will not be able to drive home after your procedure.  On day of cardioversion return to normal dosing of Cardizem 300mg 

## 2018-08-10 NOTE — Progress Notes (Signed)
Primary Care Physician: Gweneth Dimitri, MD Primary Cardiologist: Dr Mayford Knife Referring Physician: Dr Legrand Pitts is a 63 y.o. male with a history of paroxysmal atrial fibrillation who presents for consultation in the [facility]. He was diagnosed atrial fibrillation several years ago and done well on flecainide therapy. He reports that he only has episodes 1-2 times per year and these episodes only last for a few minutes. However, this past Saturday he reports that he went into atrial fibrillation and has been in it for the last 3 days. He does have symptoms of palpitations, lightheaded, fatigue and shortness of breath. He called the after hours line and his diltiazem was increased to 360 mg daily. Of note, he has been dealing with an URI and has been taking decongestants.  Today, he denies symptoms of chest pain, orthopnea, PND, lower extremity edema, dizziness, presyncope, syncope, snoring, daytime somnolence, bleeding, or neurologic sequela. The patient is tolerating medications without difficulties and is otherwise without complaint today.    Atrial Fibrillation Risk Factors:  he does have symptoms or diagnosis of sleep apnea. Previous plans for sleep study with Dr Mayford Knife noted.  he does not have a history of rheumatic fever.  he does not have a history of alcohol use.  he has a BMI of Body mass index is 33.61 kg/m.Marland Kitchen Filed Weights   08/10/18 1330  Weight: 109.3 kg     Atrial Fibrillation Management history:  Previous antiarrhythmic drugs: flecainide  Previous cardioversions: 02/23/12, unsuccessful - spontaneously converted later.  Previous ablations: none  CHADS2VASC score: 0  Anticoagulation history: none  Past Medical History:  Diagnosis Date  . Abnormal blood test 02/24/2012  . Atrial fibrillation (HCC)   . Shingles    X3   Past Surgical History:  Procedure Laterality Date  . FINGER FRACTURE SURGERY    . KNEE ARTHROSCOPY  Left   . TONSILLECTOMY      Current Outpatient Medications  Medication Sig Dispense Refill  . atorvastatin (LIPITOR) 10 MG tablet Take 10 mg by mouth daily.  0  . diltiazem (CARDIZEM CD) 360 MG 24 hr capsule Take 1 capsule (360 mg total) by mouth daily. 30 capsule 11  . flecainide (TAMBOCOR) 100 MG tablet Take 1 tablet (100 mg total) by mouth every 12 (twelve) hours. 180 tablet 3  . fluticasone (FLONASE) 50 MCG/ACT nasal spray Place 1 spray into both nostrils as needed (for allergies).     . Glucosamine-Chondroitin (GLUCOSAMINE CHONDR COMPLEX PO) Take 1 tablet by mouth daily.     . Omega-3 Fatty Acids (FISH OIL PO) Take 1 capsule by mouth daily.     . rivaroxaban (XARELTO) 20 MG TABS tablet Take 1 tablet (20 mg total) by mouth daily with supper. 30 tablet 0   No current facility-administered medications for this encounter.     Allergies  Allergen Reactions  . Penicillins     Social History   Socioeconomic History  . Marital status: Single    Spouse name: Not on file  . Number of children: Not on file  . Years of education: Not on file  . Highest education level: Not on file  Occupational History    Employer: [employer]  Social Needs  . Financial resource strain: Not on file  . Food insecurity:    Worry: Not on file    Inability: Not on file  . Transportation needs:    Medical: Not on file    Non-medical:  Not on file  Tobacco Use  . Smoking status: Never Smoker  . Smokeless tobacco: Never Used  Substance and Sexual Activity  . Alcohol use: No  . Drug use: No  . Sexual activity: Not on file  Lifestyle  . Physical activity:    Days per week: Not on file    Minutes per session: Not on file  . Stress: Not on file  Relationships  . Social connections:    Talks on phone: Not on file    Gets together: Not on file    Attends religious service: Not on file    Active member of club or organization: Not on file    Attends meetings of clubs or organizations:  Not on file    Relationship status: Not on file  . Intimate partner violence:    Fear of current or ex partner: Not on file    Emotionally abused: Not on file    Physically abused: Not on file    Forced sexual activity: Not on file  Other Topics Concern  . Not on file  Social History Narrative  . Not on file    Family History  Problem Relation Age of Onset  . Heart disease Mother   . Diabetes Mother   . Cancer - Lung Father   . Cancer - Other Sister    The patient does not have a history of early familial atrial fibrillation or other arrhythmias.  ROS- All systems are reviewed and negative except as per the HPI above.  Physical Exam: Vitals:   08/10/18 1330  BP: 118/74  Pulse: (!) 105  Weight: 109.3 kg  Height: 5' 11" (1.803 m)    GEN- The patient is well appearing, alert and oriented x 3 today.   Head- normocephalic, atraumatic Eyes-  Sclera clear, conjunctiva pink Ears- hearing intact Oropharynx- clear Neck- supple  Lungs- Clear to ausculation bilaterally, normal work of breathing Heart- irregular rate and rhythm, no murmurs, rubs or gallops  GI- soft, NT, ND, + BS Extremities- no clubbing, cyanosis, or edema MS- no significant deformity or atrophy Skin- no rash or lesion Psych- euthymic mood, full affect Neuro- strength and sensation are intact  Wt Readings from Last 3 Encounters:  08/10/18 109.3 kg  09/09/17 112.5 kg  08/06/16 112 kg    EKG today demonstrates atrial flutter HR 105, slow R wave prog, LAFB, QRS 100, QTc 475  Echo 02/2012 demonstrated  Left ventricle: The cavity size was normal. Wall thickness was increased in a pattern of moderate LVH. Systolic function was normal. The estimated ejection fraction was in the range of 55% to 65%. Wall motion was normal; there were no regional wall motion abnormalities. Left ventricular diastolic function parameters were normal. - Aortic valve: Trivial regurgitation. - Mitral valve: Mild  regurgitation. - Atrial septum: No defect or patent foramen ovale was identified.  Epic records are reviewed at length today  Assessment and Plan:  1. Persisent atrial fibrillation/atrial flutter The patient has been maintained on flecainide and done well for several years. Now appears to be in atrial flutter. Will arrange for TEE/cardioversion. Started on Xarelto 20 mg daily. Continue for 4 weeks post DCCV. Check CBC/Bmet Continue diltiazem 360 mg daily for now. Resume usual 300 mg dose after cardioversion.  This patients CHA2DS2-VASc Score and unadjusted Ischemic Stroke Rate (% per year) is equal to 0.2 % stroke rate/year from a score of 0  Above score calculated as 1 point each if present [CHF, HTN, DM, Vascular=MI/PAD/Aortic   Plaque, Age if 43-74, or Male] Above score calculated as 2 points each if present [Age > 75, or Stroke/TIA/TE]  A long discussion with the patient was had today regarding therapeutic strategies. Discussion of lifestyle modification including regular daily activity and alcohol avoidence was also discussed.   2. Obesity As above, lifestyle modification was discussed at length including regular exercise and weight reduction.  3. Snoring The importance of adequate treatment of sleep apnea was discussed today in order to improve our ability to maintain sinus rhythm long term. Plans for sleep study with Dr Mayford Knife last year noted. Will defer testing to her.   Follow up in Afib clinic 1-2 weeks post DCCV.  Danice Goltz, [place] 08/10/2018 2:26 PM

## 2018-08-10 NOTE — H&P (View-Only) (Signed)
Primary Care Physician: Gweneth Dimitri, MD Primary Cardiologist: Dr Mayford Knife Referring Physician: Dr Legrand Pitts is a 63 y.o. male with a history of paroxysmal atrial fibrillation who presents for consultation in the [facility]. He was diagnosed atrial fibrillation several years ago and done well on flecainide therapy. He reports that he only has episodes 1-2 times per year and these episodes only last for a few minutes. However, this past Saturday he reports that he went into atrial fibrillation and has been in it for the last 3 days. He does have symptoms of palpitations, lightheaded, fatigue and shortness of breath. He called the after hours line and his diltiazem was increased to 360 mg daily. Of note, he has been dealing with an URI and has been taking decongestants.  Today, he denies symptoms of chest pain, orthopnea, PND, lower extremity edema, dizziness, presyncope, syncope, snoring, daytime somnolence, bleeding, or neurologic sequela. The patient is tolerating medications without difficulties and is otherwise without complaint today.    Atrial Fibrillation Risk Factors:  he does have symptoms or diagnosis of sleep apnea. Previous plans for sleep study with Dr Mayford Knife noted.  he does not have a history of rheumatic fever.  he does not have a history of alcohol use.  he has a BMI of Body mass index is 33.61 kg/m.Marland Kitchen Filed Weights   08/10/18 1330  Weight: 109.3 kg     Atrial Fibrillation Management history:  Previous antiarrhythmic drugs: flecainide  Previous cardioversions: 02/23/12, unsuccessful - spontaneously converted later.  Previous ablations: none  CHADS2VASC score: 0  Anticoagulation history: none  Past Medical History:  Diagnosis Date  . Abnormal blood test 02/24/2012  . Atrial fibrillation (HCC)   . Shingles    X3   Past Surgical History:  Procedure Laterality Date  . FINGER FRACTURE SURGERY    . KNEE ARTHROSCOPY  Left   . TONSILLECTOMY      Current Outpatient Medications  Medication Sig Dispense Refill  . atorvastatin (LIPITOR) 10 MG tablet Take 10 mg by mouth daily.  0  . diltiazem (CARDIZEM CD) 360 MG 24 hr capsule Take 1 capsule (360 mg total) by mouth daily. 30 capsule 11  . flecainide (TAMBOCOR) 100 MG tablet Take 1 tablet (100 mg total) by mouth every 12 (twelve) hours. 180 tablet 3  . fluticasone (FLONASE) 50 MCG/ACT nasal spray Place 1 spray into both nostrils as needed (for allergies).     . Glucosamine-Chondroitin (GLUCOSAMINE CHONDR COMPLEX PO) Take 1 tablet by mouth daily.     . Omega-3 Fatty Acids (FISH OIL PO) Take 1 capsule by mouth daily.     . rivaroxaban (XARELTO) 20 MG TABS tablet Take 1 tablet (20 mg total) by mouth daily with supper. 30 tablet 0   No current facility-administered medications for this encounter.     Allergies  Allergen Reactions  . Penicillins     Social History   Socioeconomic History  . Marital status: Single    Spouse name: Not on file  . Number of children: Not on file  . Years of education: Not on file  . Highest education level: Not on file  Occupational History    Employer: [employer]  Social Needs  . Financial resource strain: Not on file  . Food insecurity:    Worry: Not on file    Inability: Not on file  . Transportation needs:    Medical: Not on file    Non-medical:  Not on file  Tobacco Use  . Smoking status: Never Smoker  . Smokeless tobacco: Never Used  Substance and Sexual Activity  . Alcohol use: No  . Drug use: No  . Sexual activity: Not on file  Lifestyle  . Physical activity:    Days per week: Not on file    Minutes per session: Not on file  . Stress: Not on file  Relationships  . Social connections:    Talks on phone: Not on file    Gets together: Not on file    Attends religious service: Not on file    Active member of club or organization: Not on file    Attends meetings of clubs or organizations:  Not on file    Relationship status: Not on file  . Intimate partner violence:    Fear of current or ex partner: Not on file    Emotionally abused: Not on file    Physically abused: Not on file    Forced sexual activity: Not on file  Other Topics Concern  . Not on file  Social History Narrative  . Not on file    Family History  Problem Relation Age of Onset  . Heart disease Mother   . Diabetes Mother   . Cancer - Lung Father   . Cancer - Other Sister    The patient does not have a history of early familial atrial fibrillation or other arrhythmias.  ROS- All systems are reviewed and negative except as per the HPI above.  Physical Exam: Vitals:   08/10/18 1330  BP: 118/74  Pulse: (!) 105  Weight: 109.3 kg  Height: 5\' 11"  (1.803 m)    GEN- The patient is well appearing, alert and oriented x 3 today.   Head- normocephalic, atraumatic Eyes-  Sclera clear, conjunctiva pink Ears- hearing intact Oropharynx- clear Neck- supple  Lungs- Clear to ausculation bilaterally, normal work of breathing Heart- irregular rate and rhythm, no murmurs, rubs or gallops  GI- soft, NT, ND, + BS Extremities- no clubbing, cyanosis, or edema MS- no significant deformity or atrophy Skin- no rash or lesion Psych- euthymic mood, full affect Neuro- strength and sensation are intact  Wt Readings from Last 3 Encounters:  08/10/18 109.3 kg  09/09/17 112.5 kg  08/06/16 112 kg    EKG today demonstrates atrial flutter HR 105, slow R wave prog, LAFB, QRS 100, QTc 475  Echo 02/2012 demonstrated  Left ventricle: The cavity size was normal. Wall thickness was increased in a pattern of moderate LVH. Systolic function was normal. The estimated ejection fraction was in the range of 55% to 65%. Wall motion was normal; there were no regional wall motion abnormalities. Left ventricular diastolic function parameters were normal. - Aortic valve: Trivial regurgitation. - Mitral valve: Mild  regurgitation. - Atrial septum: No defect or patent foramen ovale was identified.  Epic records are reviewed at length today  Assessment and Plan:  1. Persisent atrial fibrillation/atrial flutter The patient has been maintained on flecainide and done well for several years. Now appears to be in atrial flutter. Will arrange for TEE/cardioversion. Started on Xarelto 20 mg daily. Continue for 4 weeks post DCCV. Check CBC/Bmet Continue diltiazem 360 mg daily for now. Resume usual 300 mg dose after cardioversion.  This patients CHA2DS2-VASc Score and unadjusted Ischemic Stroke Rate (% per year) is equal to 0.2 % stroke rate/year from a score of 0  Above score calculated as 1 point each if present [CHF, HTN, DM, Vascular=MI/PAD/Aortic  Plaque, Age if 43-74, or Male] Above score calculated as 2 points each if present [Age > 75, or Stroke/TIA/TE]  A long discussion with the patient was had today regarding therapeutic strategies. Discussion of lifestyle modification including regular daily activity and alcohol avoidence was also discussed.   2. Obesity As above, lifestyle modification was discussed at length including regular exercise and weight reduction.  3. Snoring The importance of adequate treatment of sleep apnea was discussed today in order to improve our ability to maintain sinus rhythm long term. Plans for sleep study with Dr Mayford Knife last year noted. Will defer testing to her.   Follow up in Afib clinic 1-2 weeks post DCCV.  Danice Goltz, [place] 08/10/2018 2:26 PM

## 2018-08-10 NOTE — Telephone Encounter (Signed)
  Patient called after hours line last night because his heart is out of rhythm and the on call doctor prescribed him some medication but pt states that it is not helping. He was told to call today to see if he could get in to see Dr Mayford Knife today. I checked her and the PA's schedules and could not find anything available. Patient would like a call for advice since he is still in afib or to see if he can be worked in.

## 2018-08-10 NOTE — Telephone Encounter (Signed)
Spoke with the patient, he has been going in and out of afib for about 6 years. He stated that on Saturday at 6 am, he noticed we went into afib. He took an extra flecainide and at 4:30 he felt like he converted to NSR for 30 minutes, but then returned to afib. His heart is around 110s since Saturday. The patient stated when he is in afib he feels jittery, indigestion and slightly lightheaded during position changes. He denies SOB or chest pain. Spoke with Dr. Mayford Knifeurner the patient should be referred to afib clinic. The patient was scheduled at 1:30.

## 2018-08-19 ENCOUNTER — Ambulatory Visit (HOSPITAL_BASED_OUTPATIENT_CLINIC_OR_DEPARTMENT_OTHER): Payer: 59

## 2018-08-19 ENCOUNTER — Other Ambulatory Visit: Payer: Self-pay

## 2018-08-19 ENCOUNTER — Encounter (HOSPITAL_COMMUNITY): Payer: Self-pay | Admitting: Emergency Medicine

## 2018-08-19 ENCOUNTER — Encounter (HOSPITAL_COMMUNITY)
Admission: RE | Disposition: A | Discharge status: Home / Self Care | Payer: Self-pay | Source: Home / Self Care | Attending: Cardiology

## 2018-08-19 ENCOUNTER — Ambulatory Visit (HOSPITAL_COMMUNITY)
Admission: RE | Admit: 2018-08-19 | Discharge: 2018-08-19 | Disposition: A | Discharge status: Home / Self Care | Payer: 59 | Attending: Cardiology | Admitting: Cardiology

## 2018-08-19 ENCOUNTER — Ambulatory Visit (HOSPITAL_COMMUNITY): Payer: 59 | Admitting: Certified Registered Nurse Anesthetist

## 2018-08-19 DIAGNOSIS — I484 Atypical atrial flutter: Secondary | ICD-10-CM

## 2018-08-19 DIAGNOSIS — I4892 Unspecified atrial flutter: Secondary | ICD-10-CM | POA: Insufficient documentation

## 2018-08-19 DIAGNOSIS — E669 Obesity, unspecified: Secondary | ICD-10-CM | POA: Diagnosis not present

## 2018-08-19 DIAGNOSIS — Z79899 Other long term (current) drug therapy: Secondary | ICD-10-CM | POA: Insufficient documentation

## 2018-08-19 DIAGNOSIS — Z7901 Long term (current) use of anticoagulants: Secondary | ICD-10-CM | POA: Diagnosis not present

## 2018-08-19 DIAGNOSIS — I4819 Other persistent atrial fibrillation: Secondary | ICD-10-CM | POA: Diagnosis not present

## 2018-08-19 DIAGNOSIS — R0683 Snoring: Secondary | ICD-10-CM | POA: Insufficient documentation

## 2018-08-19 DIAGNOSIS — Z6833 Body mass index (BMI) 33.0-33.9, adult: Secondary | ICD-10-CM | POA: Diagnosis not present

## 2018-08-19 DIAGNOSIS — I340 Nonrheumatic mitral (valve) insufficiency: Secondary | ICD-10-CM | POA: Diagnosis not present

## 2018-08-19 HISTORY — PX: CARDIOVERSION: SHX1299

## 2018-08-19 HISTORY — PX: TEE WITHOUT CARDIOVERSION: SHX5443

## 2018-08-19 SURGERY — ECHOCARDIOGRAM, TRANSESOPHAGEAL
Anesthesia: Monitor Anesthesia Care

## 2018-08-19 MED ORDER — PROPOFOL 10 MG/ML IV BOLUS
INTRAVENOUS | Status: DC | PRN
  Administered 2018-08-19 (×2): 30 mg via INTRAVENOUS
  Administered 2018-08-19: 50 mg via INTRAVENOUS
  Administered 2018-08-19: 20 mg via INTRAVENOUS

## 2018-08-19 MED ORDER — LIDOCAINE 2% (20 MG/ML) 5 ML SYRINGE
INTRAMUSCULAR | Status: DC | PRN
  Administered 2018-08-19: 100 mg via INTRAVENOUS

## 2018-08-19 MED ORDER — SODIUM CHLORIDE (PF) 0.9 % IJ SOLN
INTRAMUSCULAR | Status: DC | PRN
  Administered 2018-08-19: 9 mL via INTRAVENOUS

## 2018-08-19 MED ORDER — SODIUM CHLORIDE 0.9 % IV SOLN
INTRAVENOUS | Status: DC
  Administered 2018-08-19: 08:00:00 via INTRAVENOUS

## 2018-08-19 MED ORDER — PROPOFOL 500 MG/50ML IV EMUL
INTRAVENOUS | Status: DC | PRN
  Administered 2018-08-19: 100 ug/kg/min via INTRAVENOUS

## 2018-08-19 NOTE — Progress Notes (Signed)
  Echocardiogram Echocardiogram Transesophageal has been performed.  Cesar Wheeler 08/19/2018, 8:42 AM

## 2018-08-19 NOTE — Transfer of Care (Signed)
Immediate Anesthesia Transfer of Care Note  Patient: Cesar Wheeler  Procedure(s) Performed: TRANSESOPHAGEAL ECHOCARDIOGRAM (TEE) (N/A ) CARDIOVERSION (N/A ) BUBBLE STUDY  Patient Location: PACU  Anesthesia Type:MAC  Level of Consciousness: drowsy and patient cooperative  Airway & Oxygen Therapy: Patient Spontanous Breathing and Patient connected to nasal cannula oxygen  Post-op Assessment: Report given to RN and Post -op Vital signs reviewed and stable  Post vital signs: Reviewed and stable  Last Vitals:  Vitals Value Taken Time  BP    Temp    Pulse 66 08/19/2018  8:36 AM  Resp    SpO2 95 % 08/19/2018  8:36 AM  Vitals shown include unvalidated device data.  Last Pain:  Vitals:   08/19/18 0719  TempSrc: Oral         Complications: No apparent anesthesia complications

## 2018-08-19 NOTE — Anesthesia Postprocedure Evaluation (Signed)
Anesthesia Post Note  Patient: Cesar Wheeler  Procedure(s) Performed: TRANSESOPHAGEAL ECHOCARDIOGRAM (TEE) (N/A ) CARDIOVERSION (N/A ) BUBBLE STUDY     Patient location during evaluation: PACU Anesthesia Type: MAC Level of consciousness: awake and alert Pain management: pain level controlled Vital Signs Assessment: post-procedure vital signs reviewed and stable Respiratory status: spontaneous breathing, nonlabored ventilation and respiratory function stable Cardiovascular status: blood pressure returned to baseline and stable Postop Assessment: no apparent nausea or vomiting Anesthetic complications: no    Last Vitals:  Vitals:   08/19/18 0719 08/19/18 0837  BP: 131/89 106/65  Pulse: 95 65  Resp: 18 12  Temp: 36.6 C   SpO2: 96% 97%    Last Pain:  Vitals:   08/19/18 0719  TempSrc: Oral                 Lidia Collum

## 2018-08-19 NOTE — Discharge Instructions (Signed)
Electrical Cardioversion, Care After °This sheet gives you information about how to care for yourself after your procedure. Your health care provider may also give you more specific instructions. If you have problems or questions, contact your health care provider. °What can I expect after the procedure? °After the procedure, it is common to have: °· Some redness on the skin where the shocks were given. °Follow these instructions at home: ° °· Do not drive for 24 hours if you were given a medicine to help you relax (sedative). °· Take over-the-counter and prescription medicines only as told by your health care provider. °· Ask your health care provider how to check your pulse. Check it often. °· Rest for 48 hours after the procedure or as told by your health care provider. °· Avoid or limit your caffeine use as told by your health care provider. °Contact a health care provider if: °· You feel like your heart is beating too quickly or your pulse is not regular. °· You have a serious muscle cramp that does not go away. °Get help right away if: ° °· You have discomfort in your chest. °· You are dizzy or you feel faint. °· You have trouble breathing or you are short of breath. °· Your speech is slurred. °· You have trouble moving an arm or leg on one side of your body. °· Your fingers or toes turn cold or blue. °This information is not intended to replace advice given to you by your health care provider. Make sure you discuss any questions you have with your health care provider. °Document Released: 04/28/2013 Document Revised: 02/09/2016 Document Reviewed: 01/12/2016 °Elsevier Interactive Patient Education © 2019 [employer] ° °

## 2018-08-19 NOTE — Interval H&P Note (Signed)
History and Physical Interval Note:  08/19/2018 8:03 AM  Cesar Wheeler  has presented today for surgery, with the diagnosis of a-fib  The various methods of treatment have been discussed with the patient and family. After consideration of risks, benefits and other options for treatment, the patient has consented to  Procedure(s): TRANSESOPHAGEAL ECHOCARDIOGRAM (TEE) (N/A) CARDIOVERSION (N/A) as a surgical intervention .  The patient's history has been reviewed, patient examined, no change in status, stable for surgery.  I have reviewed the patient's chart and labs.  Questions were answered to the patient's satisfaction.     [employer]

## 2018-08-19 NOTE — CV Procedure (Signed)
   Transesophageal Echocardiogram  Indications: Atrial fltutter, atypical  Time out performed Bubble contrast administered  Propofol with anesthesia  Findings:  Left Ventricle: Normal EF 55%  Mitral Valve: Mild MR  Aortic Valve: Trace AI  Tricuspid Valve: No TR  Left Atrium: No LAA thrombus   Bubble Contrast Study: +PFO, small, early crossover bubbles  Donato Schultz, MD      Electrical Cardioversion Procedure Note RYLE LORENZINI [id number] 1956-06-28  Procedure: Electrical Cardioversion Indications:  Atrial Flutter  Time Out: Verified patient identification, verified procedure,medications/allergies/relevent history reviewed, required imaging and test results available.  Performed  Procedure Details  The patient was NPO after midnight. Anesthesia was administerd with propofol.  Cardioversion was performed with synchronized biphasic defibrillation via AP pads with 120 joules.  1 attempt(s) were performed.  The patient converted to normal sinus rhythm. The patient tolerated the procedure well   IMPRESSION:  Successful cardioversion of atrial flutter   Donato Schultz 08/19/2018, 8:32 AM

## 2018-08-19 NOTE — Anesthesia Preprocedure Evaluation (Signed)
Anesthesia Evaluation  Patient identified by MRN, date of birth, ID band Patient awake    Reviewed: Allergy & Precautions, NPO status , Patient's Chart, lab work & pertinent test results  History of Anesthesia Complications Negative for: history of anesthetic complications  Airway Mallampati: III  TM Distance: >3 FB Neck ROM: Full    Dental no notable dental hx.    Pulmonary sleep apnea ,    Pulmonary exam normal        Cardiovascular + dysrhythmias Atrial Fibrillation  Rhythm:Irregular Rate:Normal     Neuro/Psych negative neurological ROS  negative psych ROS   GI/Hepatic Neg liver ROS, GERD  ,  Endo/Other  negative endocrine ROS  Renal/GU negative Renal ROS  negative genitourinary   Musculoskeletal negative musculoskeletal ROS (+)   Abdominal   Peds  Hematology negative hematology ROS (+)   Anesthesia Other Findings   Reproductive/Obstetrics                             Anesthesia Physical Anesthesia Plan  ASA: II  Anesthesia Plan: MAC   Post-op Pain Management:    Induction:   PONV Risk Score and Plan: 1 and Propofol infusion and Treatment may vary due to age or medical condition  Airway Management Planned: Nasal Cannula and Simple Face Mask  Additional Equipment: None  Intra-op Plan:   Post-operative Plan:   Informed Consent: I have reviewed the patients History and Physical, chart, labs and discussed the procedure including the risks, benefits and alternatives for the proposed anesthesia with the patient or authorized representative who has indicated his/her understanding and acceptance.       Plan Discussed with:   Anesthesia Plan Comments:         Anesthesia Quick Evaluation

## 2018-08-21 ENCOUNTER — Encounter (HOSPITAL_COMMUNITY): Payer: Self-pay | Admitting: Cardiology

## 2018-08-25 NOTE — Progress Notes (Signed)
Primary Care Physician: Gweneth Dimitri, MD Primary Cardiologist: Dr Mayford Knife Referring Physician: Dr Legrand Pitts is a 63 y.o. male with a history of paroxysmal atrial fibrillation who presents for consultation in the [facility]. He was diagnosed atrial fibrillation several years ago and done well on flecainide therapy. He reports that he only has episodes 1-2 times per year and these episodes only last for a few minutes. He is now s/p TEE/DCCV on 08/19/18 and he feels much better. He has had no additional afib. He has not missed any doses of anticoagulation.   Today, he denies symptoms of chest pain, orthopnea, PND, lower extremity edema, dizziness, presyncope, syncope, bleeding, or neurologic sequela. The patient is tolerating medications without difficulties and is otherwise without complaint today.    Atrial Fibrillation Risk Factors:  he does have symptoms or diagnosis of sleep apnea. Previous plans for sleep study with Dr Mayford Knife noted.  he does not have a history of rheumatic fever.  he does not have a history of alcohol use.  he has a BMI of Body mass index is 33.89 kg/m.Marland Kitchen Filed Weights   08/26/18 1005  Weight: 110.2 kg     Atrial Fibrillation Management history:  Previous antiarrhythmic drugs: flecainide Previous cardioversions: 02/23/12, 08/19/18 Previous ablations: none CHADS2VASC score: 0 Anticoagulation history: Xarelto post DCCV  Past Medical History:  Diagnosis Date  . Abnormal blood test 02/24/2012  . Atrial fibrillation (HCC)   . Shingles    X3   Past Surgical History:  Procedure Laterality Date  . CARDIOVERSION N/A 08/19/2018   Procedure: CARDIOVERSION;  Surgeon: Jake Bathe, MD;  Location: [facility] ENDOSCOPY;  Service: Cardiovascular;  Laterality: N/A;  . FINGER FRACTURE SURGERY    . KNEE ARTHROSCOPY Left   . TEE WITHOUT CARDIOVERSION N/A 08/19/2018   Procedure: TRANSESOPHAGEAL ECHOCARDIOGRAM (TEE);  Surgeon: Jake Bathe, MD;  Location: [facility] ENDOSCOPY;  Service: Cardiovascular;  Laterality: N/A;  . TONSILLECTOMY      Current Outpatient Medications  Medication Sig Dispense Refill  . atorvastatin (LIPITOR) 10 MG tablet Take 10 mg by mouth daily.  0  . diltiazem (TIAZAC) 300 MG 24 hr capsule Take 300 mg by mouth daily.    . flecainide (TAMBOCOR) 100 MG tablet Take 1 tablet (100 mg total) by mouth every 12 (twelve) hours. 180 tablet 3  . fluticasone (FLONASE) 50 MCG/ACT nasal spray Place 1 spray into both nostrils daily as needed for allergies.     . rivaroxaban (XARELTO) 20 MG TABS tablet Take 1 tablet (20 mg total) by mouth daily with supper. 30 tablet 0  . Glucosamine-Chondroitin (GLUCOSAMINE CHONDR COMPLEX PO) Take 1 tablet by mouth daily.     . Omega-3 Fatty Acids (FISH OIL PO) Take 1 capsule by mouth daily.      No current facility-administered medications for this encounter.     Allergies  Allergen Reactions  . Penicillins Other (See Comments)    Childhood allergy, almost died    Social History   Socioeconomic History  . Marital status: Married    Spouse name: Not on file  . Number of children: Not on file  . Years of education: Not on file  . Highest education level: Not on file  Occupational History    Employer: [employer]  Social Needs  . Financial resource strain: Not on file  . Food insecurity:    Worry: Not on file    Inability: Not on file  .  Transportation needs:    Medical: Not on file    Non-medical: Not on file  Tobacco Use  . Smoking status: Never Smoker  . Smokeless tobacco: Never Used  Substance and Sexual Activity  . Alcohol use: No  . Drug use: No  . Sexual activity: Not on file  Lifestyle  . Physical activity:    Days per week: Not on file    Minutes per session: Not on file  . Stress: Not on file  Relationships  . Social connections:    Talks on phone: Not on file    Gets together: Not on file    Attends religious service: Not on file     Active member of club or organization: Not on file    Attends meetings of clubs or organizations: Not on file    Relationship status: Not on file  . Intimate partner violence:    Fear of current or ex partner: Not on file    Emotionally abused: Not on file    Physically abused: Not on file    Forced sexual activity: Not on file  Other Topics Concern  . Not on file  Social History Narrative  . Not on file    Family History  Problem Relation Age of Onset  . Heart disease Mother   . Diabetes Mother   . Cancer - Lung Father   . Cancer - Other Sister    The patient does have a history of early familial atrial fibrillation or other arrhythmias.  ROS- All systems are reviewed and negative except as per the HPI above.  Physical Exam: Vitals:   08/26/18 1005  BP: 118/80  Pulse: 65  Weight: 110.2 kg  Height: 5\' 11"  (1.803 m)    GEN- The patient is well appearing, alert and oriented x 3 today.   HEENT-head normocephalic, atraumatic, sclera clear, conjunctiva pink, hearing intact, trachea midline. Lungs- Clear to ausculation bilaterally, normal work of breathing Heart- Regular rate and rhythm, no murmurs, rubs or gallops  GI- soft, NT, ND, + BS Extremities- no clubbing, cyanosis, or edema MS- no significant deformity or atrophy Skin- no rash or lesion Psych- euthymic mood, full affect Neuro- strength and sensation are intact   Wt Readings from Last 3 Encounters:  08/26/18 110.2 kg  08/19/18 106.6 kg  08/10/18 109.3 kg    EKG today demonstrates SR HR 65, 1st degree AV block, slow R wave prog, PR 208, QRS 102, QTc 440  Echo 02/2012 demonstrated  Left ventricle: The cavity size was normal. Wall thickness was increased in a pattern of moderate LVH. Systolic function was normal. The estimated ejection fraction was in the range of 55% to 65%. Wall motion was normal; there were no regional wall motion abnormalities. Left ventricular diastolic function parameters were  normal. - Aortic valve: Trivial regurgitation. - Mitral valve: Mild regurgitation. - Atrial septum: No defect or patent foramen ovale was identified.  Epic records are reviewed at length today  Assessment and Plan:  1. Persisent atrial fibrillation/atrial flutter The patient has been maintained on flecainide. S/p TEE/DCCV on 08/19/18. Continue Xarelto 20 mg daily for 4 weeks post DCCV. Continue diltiazem 300 mg daily  Continue flecainide 100 mg BID. Intervals stable. Patient instructed on what to do if he goes into afib again.  This patients CHA2DS2-VASc Score and unadjusted Ischemic Stroke Rate (% per year) is equal to 0.2 % stroke rate/year from a score of 0  Above score calculated as 1 point each if present [  CHF, HTN, DM, Vascular=MI/PAD/Aortic Plaque, Age if 65-74, or Male] Above score calculated as 2 points each if present [Age > 75, or Stroke/TIA/TE]  2. Obesity As above, lifestyle modification was discussed at length including regular exercise and weight reduction.  3. Snoring Plans for sleep study with Dr Mayford Knifeurner last year noted. Will defer testing to Dr Mayford Knifeurner.   Follow up with Dr Mayford Knifeurner in April. Afib clinic in 6 months.  Clide CliffRicky Meelah Tallo PA-C 08/26/2018 10:38 AM

## 2018-08-26 ENCOUNTER — Ambulatory Visit (HOSPITAL_COMMUNITY)
Admission: RE | Admit: 2018-08-26 | Discharge: 2018-08-26 | Disposition: A | Discharge status: Home / Self Care | Payer: 59 | Source: Ambulatory Visit | Attending: Physician Assistant | Admitting: Physician Assistant

## 2018-08-26 ENCOUNTER — Encounter (HOSPITAL_COMMUNITY): Payer: Self-pay | Admitting: Physician Assistant

## 2018-08-26 VITALS — BP 118/80 | HR 65 | Ht 71.0 in | Wt 243.0 lb

## 2018-08-26 DIAGNOSIS — I4819 Other persistent atrial fibrillation: Secondary | ICD-10-CM | POA: Diagnosis not present

## 2018-08-26 DIAGNOSIS — E669 Obesity, unspecified: Secondary | ICD-10-CM | POA: Diagnosis not present

## 2018-08-26 DIAGNOSIS — Z79899 Other long term (current) drug therapy: Secondary | ICD-10-CM | POA: Diagnosis not present

## 2018-08-26 DIAGNOSIS — I480 Paroxysmal atrial fibrillation: Secondary | ICD-10-CM | POA: Diagnosis not present

## 2018-08-26 DIAGNOSIS — Z6833 Body mass index (BMI) 33.0-33.9, adult: Secondary | ICD-10-CM | POA: Diagnosis not present

## 2018-08-26 DIAGNOSIS — I4892 Unspecified atrial flutter: Secondary | ICD-10-CM | POA: Diagnosis not present

## 2018-08-26 DIAGNOSIS — R0683 Snoring: Secondary | ICD-10-CM | POA: Insufficient documentation

## 2018-08-26 NOTE — Patient Instructions (Addendum)
May stop xarelto after 2/29.   Follow up in 6 months

## 2018-10-02 ENCOUNTER — Other Ambulatory Visit: Payer: Self-pay | Admitting: Cardiology

## 2018-10-02 DIAGNOSIS — I480 Paroxysmal atrial fibrillation: Secondary | ICD-10-CM

## 2018-11-05 ENCOUNTER — Telehealth: Payer: Self-pay | Admitting: Cardiology

## 2018-11-05 NOTE — Telephone Encounter (Signed)
Spoke with patient - He is doing fine and wanted me to reschedule his appointment to Aug.   Schedule now for 03/11/19 @ 9am

## 2018-11-09 ENCOUNTER — Ambulatory Visit: Payer: 59 | Admitting: Cardiology

## 2018-12-30 ENCOUNTER — Other Ambulatory Visit: Payer: Self-pay | Admitting: Cardiology

## 2018-12-30 DIAGNOSIS — I480 Paroxysmal atrial fibrillation: Secondary | ICD-10-CM

## 2019-03-10 DIAGNOSIS — G4733 Obstructive sleep apnea (adult) (pediatric): Secondary | ICD-10-CM | POA: Insufficient documentation

## 2019-03-10 DIAGNOSIS — E669 Obesity, unspecified: Secondary | ICD-10-CM | POA: Insufficient documentation

## 2019-03-10 NOTE — Progress Notes (Signed)
Virtual Visit via telephone Note   This visit type was conducted due to national recommendations for restrictions regarding the COVID-19 Pandemic (e.g. social distancing) in an effort to limit this patient's exposure and mitigate transmission in our community.  Due to his co-morbid illnesses, this patient is at least at moderate risk for complications without adequate follow up.  This format is felt to be most appropriate for this patient at this time.  All issues noted in this document were discussed and addressed.  A limited physical exam was performed with this format.  Please refer to the patient's chart for his consent to telehealth [facility].   Evaluation Performed:  Follow-up visit  This visit type was conducted due to national recommendations for restrictions regarding the COVID-19 Pandemic (e.g. social distancing).  This format is felt to be most appropriate for this patient at this time.  All issues noted in this document were discussed and addressed.  No physical exam was performed (except for noted visual exam findings with Video Visits).  Please refer to the patient's chart (MyChart message for video visits and phone note for telephone visits) for the patient's consent to telehealth [facility].  Date:  03/11/2019   ID:  Cesar Wheeler, DOB 1956/03/07, MRN [id number]  Patient Location:  Home  [place]   [place]  PCP:  Gweneth DimitriMcNeill, Wendy, MD  Cardiologist:  Armanda Magicraci Turner, MD Electrophysiologist:  None   Chief Complaint:  Atrial fibrillation  History of Present Illness:    Cesar Wheeler is a 63 y.o. male who presents via audio/video conferencing for a telehealth visit today.    Cesar Wheeler is a 63 y.o. male with a hx of persistentatrial fibrillation with aChads2VASC scoreof0.  His echocardiogram 02/25/12 showed an ejection fraction of 55-65% with mild mitral regurgitation and his atrial size was at the upper limit of normal. He also has a history  of dyslipidemia and is on Lipitorwhich he tolerates well.   He was doing well up until January 2020 when he had recurrent afib and underwent TEE/DCCV to NSR and started on Xarelto 20mg  daily for 4 weeks.  He apparently had been taking mucinex with a decongestant in it and had been taking it for weeks and felt to likely have triggered his afib.  TEE at that time showed NSR with trivial AI, mild MR, no LA/LAA thrombus and a small R>L PFO.   He was seen back in afib clinic and was maintaining NSR.   He had undergone HST a year ago with mild OSA with an AHI of 11/hr but was not interested in PAP therapy as he had a sinus infection was sure that that caused his OSA.     He is here today for followup and is doing well. He denies any chest pain or pressure, SOB, DOE, PND, orthopnea, LE edema, dizziness, palpitations or syncope. He is compliant with his meds and is tolerating meds with no SE.    The patient does not have symptoms concerning for COVID-19 infection (fever, chills, cough, or new shortness of breath).    Prior CV studies:   The following studies were reviewed today:  none  Past Medical History:  Diagnosis Date  . Abnormal blood test 02/24/2012  . Atrial fibrillation (HCC)   . Shingles    X3   Past Surgical History:  Procedure Laterality Date  . CARDIOVERSION N/A 08/19/2018   Procedure: CARDIOVERSION;  Surgeon: Jake BatheSkains, Mark C, MD;  Location: [facility] ENDOSCOPY;  Service: Cardiovascular;  Laterality: N/A;  . FINGER FRACTURE SURGERY    . KNEE ARTHROSCOPY Left   . TEE WITHOUT CARDIOVERSION N/A 08/19/2018   Procedure: TRANSESOPHAGEAL ECHOCARDIOGRAM (TEE);  Surgeon: Jerline Pain, MD;  Location: [facility] ENDOSCOPY;  Service: Cardiovascular;  Laterality: N/A;  . TONSILLECTOMY       Current Meds  Medication Sig  . aspirin EC 81 MG tablet Take 81 mg by mouth daily.  Marland Kitchen atorvastatin (LIPITOR) 10 MG tablet Take 10 mg by mouth daily.  Marland Kitchen diltiazem (CARDIZEM CD) 300 MG 24 hr capsule TAKE 1 CAPSULE BY  MOUTH ONCE DAILY  . flecainide (TAMBOCOR) 100 MG tablet TAKE 1 TABLET BY MOUTH EVERY 12 HOURS  . fluticasone (FLONASE) 50 MCG/ACT nasal spray Place 1 spray into both nostrils daily as needed for allergies.   . Glucosamine-Chondroitin (GLUCOSAMINE CHONDR COMPLEX PO) Take 1 tablet by mouth daily.   . Omega-3 Fatty Acids (FISH OIL PO) Take 1 capsule by mouth daily.      Allergies:   Penicillins   Social History   Tobacco Use  . Smoking status: Never Smoker  . Smokeless tobacco: Never Used  Substance Use Topics  . Alcohol use: No  . Drug use: No     Family Hx: The patient's family history includes Cancer - Lung in his father; Cancer - Other in his sister; Diabetes in his mother; Heart disease in his mother.  ROS:   Please see the history of present illness.     All other systems reviewed and are negative.   Labs/Other Tests and Data Reviewed:    Recent Labs: 08/10/2018: BUN 14; Creatinine, Ser 1.22; Hemoglobin 16.5; Platelets 283; Potassium 4.3; Sodium 139   Recent Lipid Panel No results found for: CHOL, TRIG, HDL, CHOLHDL, LDLCALC, LDLDIRECT  Wt Readings from Last 3 Encounters:  03/11/19 231 lb (104.8 kg)  08/26/18 243 lb (110.2 kg)  08/19/18 235 lb (106.6 kg)     Objective:    Vital Signs:  Ht 5\' 11"  (1.803 m)   Wt 231 lb (104.8 kg)   BMI 32.22 kg/m     ASSESSMENT & PLAN:    1.  Persistent atrial fibrillation - he is s/p TEE/DCCV for recurrent afib and was in NSR at OV with afib clinic.  He has not had any palpitations and thinks he is in NSR.  He will continue on Cartia XT 300mg  daily and Flecainide 100mg  BID.  His CHADS2VASC score is 0.    2.  Obesity - I have encouraged him to get into a routine exercise program and cut back on carbs and portions.   3.  OSA - he had mild OSA with an AHI of 8/hr but was convinced that it was due to a sinus infection he had at that time.  He has no excessive daytime sleepiness.  I have recommended that we repeat HST if he has any  reoccurrence of afib.   COVID-19 Education: The signs and symptoms of COVID-19 were discussed with the patient and how to seek care for testing (follow up with PCP or arrange E-visit).  The importance of social distancing was discussed today.  Patient Risk:   After full review of this patient's clinical status, I feel that they are at least moderate risk at this time.  Time:   Today, I have spent 15 minutes directly with the patient on telemedicine discussing medical problems including atrial fibrillation.  We also reviewed the symptoms of COVID 19 and the ways to  protect against contracting the virus with telehealth technology.  I spent an additional 5 minutes reviewing patient's chart including TEE.  Medication Adjustments/Labs and Tests Ordered: Current medicines are reviewed at length with the patient today.  Concerns regarding medicines are outlined above.  Tests Ordered: No orders of the defined types were placed in this encounter.  Medication Changes: No orders of the defined types were placed in this encounter.   Disposition:  Follow up in 1 year(s)  Signed, Armanda Magicraci Turner, MD  03/11/2019 9:12 AM    [facility]

## 2019-03-11 ENCOUNTER — Telehealth (INDEPENDENT_AMBULATORY_CARE_PROVIDER_SITE_OTHER): Payer: 59 | Admitting: Cardiology

## 2019-03-11 ENCOUNTER — Encounter: Payer: Self-pay | Admitting: Cardiology

## 2019-03-11 ENCOUNTER — Other Ambulatory Visit: Payer: Self-pay

## 2019-03-11 VITALS — BP 129/74 | HR 65 | Ht 71.0 in | Wt 231.0 lb

## 2019-03-11 DIAGNOSIS — G4733 Obstructive sleep apnea (adult) (pediatric): Secondary | ICD-10-CM | POA: Diagnosis not present

## 2019-03-11 DIAGNOSIS — E669 Obesity, unspecified: Secondary | ICD-10-CM | POA: Diagnosis not present

## 2019-03-11 DIAGNOSIS — I4819 Other persistent atrial fibrillation: Secondary | ICD-10-CM

## 2019-04-08 ENCOUNTER — Other Ambulatory Visit: Payer: Self-pay | Admitting: Cardiology

## 2019-04-08 DIAGNOSIS — I480 Paroxysmal atrial fibrillation: Secondary | ICD-10-CM

## 2019-04-08 MED ORDER — FLECAINIDE ACETATE 100 MG PO TABS: 100.0000 mg | ORAL_TABLET | Freq: Two times a day (BID) | ORAL | 3 refills | Status: DC

## 2019-05-14 ENCOUNTER — Encounter (HOSPITAL_BASED_OUTPATIENT_CLINIC_OR_DEPARTMENT_OTHER): Payer: Self-pay

## 2019-05-14 ENCOUNTER — Other Ambulatory Visit: Payer: Self-pay

## 2019-05-14 ENCOUNTER — Emergency Department (HOSPITAL_BASED_OUTPATIENT_CLINIC_OR_DEPARTMENT_OTHER)
Admission: EM | Admit: 2019-05-14 | Discharge: 2019-05-14 | Disposition: A | Discharge status: Home / Self Care | Payer: 59 | Attending: Emergency Medicine | Admitting: Emergency Medicine

## 2019-05-14 ENCOUNTER — Emergency Department (HOSPITAL_BASED_OUTPATIENT_CLINIC_OR_DEPARTMENT_OTHER): Payer: 59

## 2019-05-14 DIAGNOSIS — Z880 Allergy status to penicillin: Secondary | ICD-10-CM | POA: Insufficient documentation

## 2019-05-14 DIAGNOSIS — W01198A Fall on same level from slipping, tripping and stumbling with subsequent striking against other object, initial encounter: Secondary | ICD-10-CM | POA: Insufficient documentation

## 2019-05-14 DIAGNOSIS — S0990XA Unspecified injury of head, initial encounter: Secondary | ICD-10-CM | POA: Diagnosis present

## 2019-05-14 DIAGNOSIS — Y929 Unspecified place or not applicable: Secondary | ICD-10-CM | POA: Insufficient documentation

## 2019-05-14 DIAGNOSIS — I4819 Other persistent atrial fibrillation: Secondary | ICD-10-CM | POA: Insufficient documentation

## 2019-05-14 DIAGNOSIS — Z79899 Other long term (current) drug therapy: Secondary | ICD-10-CM | POA: Diagnosis not present

## 2019-05-14 DIAGNOSIS — S060X0A Concussion without loss of consciousness, initial encounter: Secondary | ICD-10-CM

## 2019-05-14 DIAGNOSIS — Y9389 Activity, other specified: Secondary | ICD-10-CM | POA: Insufficient documentation

## 2019-05-14 DIAGNOSIS — Z7982 Long term (current) use of aspirin: Secondary | ICD-10-CM | POA: Insufficient documentation

## 2019-05-14 DIAGNOSIS — S0003XA Contusion of scalp, initial encounter: Secondary | ICD-10-CM

## 2019-05-14 DIAGNOSIS — Y990 Civilian activity done for income or pay: Secondary | ICD-10-CM | POA: Insufficient documentation

## 2019-05-14 NOTE — ED Provider Notes (Addendum)
[facility] EMERGENCY DEPARTMENT Provider Note   CSN: [id number] Arrival date & time: 05/14/19  1524     History   Chief Complaint Chief Complaint  Patient presents with  . Head Injury    HPI Cesar Wheeler is a 63 y.o. male.     Patient works as an Personnel officerelectrician.  He stumbled while pulling wire cable backwards.  Larey SeatFell struck his head on a wall.  His buttocks on a concrete floor.  Was days.  But did not get completely knocked out.  But was confused for short period of time.  Patient with some pain to the back of the head no neck pain.  Drove himself here.  The fall occurred around 2:00 PM.  Once he got here he developed some soreness to his right buttocks area.  Low back pain no extremity pain.  Patient is not on blood thinners other than he takes a baby aspirin a day.  In addition patient without any nausea no vomiting.      Past Medical History:  Diagnosis Date  . Abnormal blood test 02/24/2012  . Atrial fibrillation (HCC)   . Shingles    X3    Patient Active Problem List   Diagnosis Date Noted  . Obesity (BMI 30-39.9) 03/10/2019  . OSA (obstructive sleep apnea) 03/10/2019  . Atypical atrial flutter (HCC)   . GERD (gastroesophageal reflux disease) 07/09/2012  . Persistent atrial fibrillation (HCC) 02/24/2012    Past Surgical History:  Procedure Laterality Date  . CARDIOVERSION N/A 08/19/2018   Procedure: CARDIOVERSION;  Surgeon: Jake BatheSkains, Mark C, MD;  [facility];  Service: Cardiovascular;  Laterality: N/A;  . FINGER FRACTURE SURGERY    . KNEE ARTHROSCOPY Left   . TEE WITHOUT CARDIOVERSION N/A 08/19/2018   Procedure: TRANSESOPHAGEAL ECHOCARDIOGRAM (TEE);  Surgeon: Jake BatheSkains, Mark C, MD;  [facility]COPY;  Service: Cardiovascular;  Laterality: N/A;  . TONSILLECTOMY          Home Medications    Prior to Admission medications   Medication Sig Start Date End Date Taking? Authorizing Provider  aspirin EC 81 MG tablet Take 81 mg by mouth daily.     [provider]  atorvastatin (LIPITOR) 10 MG tablet Take 10 mg by mouth daily. 12/25/15   [provider]  diltiazem (CARDIZEM CD) 300 MG 24 hr capsule TAKE 1 CAPSULE BY MOUTH ONCE DAILY 10/05/18   Fenton, Clint R, PA  flecainide (TAMBOCOR) 100 MG tablet Take 1 tablet (100 mg total) by mouth every 12 (twelve) hours. 04/08/19   Quintella Reicherturner, Traci R, MD  fluticasone (FLONASE) 50 MCG/ACT nasal spray Place 1 spray into both nostrils daily as needed for allergies.  05/10/12   [provider]  Glucosamine-Chondroitin (GLUCOSAMINE CHONDR COMPLEX PO) Take 1 tablet by mouth daily.     [provider]  Omega-3 Fatty Acids (FISH OIL PO) Take 1 capsule by mouth daily.     [provider]    Family History Family History  Problem Relation Age of Onset  . Heart disease Mother   . Diabetes Mother   . Cancer - Lung Father   . Cancer - Other Sister     Social History Social History   Tobacco Use  . Smoking status: Never Smoker  . Smokeless tobacco: Never Used  Substance Use Topics  . Alcohol use: No  . Drug use: No     Allergies   Penicillins   Review of Systems Review of Systems  Constitutional: Negative  for chills and fever.  HENT: Negative for congestion, rhinorrhea and sore throat.   Eyes: Negative for visual disturbance.  Respiratory: Negative for cough and shortness of breath.   Cardiovascular: Negative for chest pain and leg swelling.  Gastrointestinal: Negative for abdominal pain, diarrhea, nausea and vomiting.  Genitourinary: Negative for dysuria.  Musculoskeletal: Negative for back pain and neck pain.  Skin: Negative for rash.  Neurological: Positive for light-headedness and headaches. Negative for dizziness, seizures, syncope, facial asymmetry, speech difficulty, weakness and numbness.  Hematological: Does not bruise/bleed easily.  Psychiatric/Behavioral: Negative for confusion.     Physical Exam Updated Vital Signs BP (!) 134/92  (BP Location: Left Arm)   Temp 98.4 F (36.9 C) (Oral)   Resp 14   Ht 1.803 m (5\' 11" )   Wt 108.9 kg   SpO2 97%   BMI 33.47 kg/m   Physical Exam Vitals signs and nursing note reviewed.  Constitutional:      Appearance: Normal appearance. He is well-developed.  HENT:     Head: Normocephalic.     Comments: Occiput of the head with a 1 to 2 cm area of bruising.  No step-off. Eyes:     Extraocular Movements: Extraocular movements intact.     Conjunctiva/sclera: Conjunctivae normal.     Pupils: Pupils are equal, round, and reactive to light.  Neck:     Musculoskeletal: Normal range of motion and neck supple. No neck rigidity or muscular tenderness.     Comments: No posterior tenderness to palpation the cervical spine.  Excellent range of motion. Cardiovascular:     Rate and Rhythm: Normal rate and regular rhythm.     Heart sounds: No murmur.  Pulmonary:     Effort: Pulmonary effort is normal. No respiratory distress.     Breath sounds: Normal breath sounds.  Abdominal:     Palpations: Abdomen is soft.     Tenderness: There is no abdominal tenderness.  Musculoskeletal: Normal range of motion.        General: Signs of injury present.     Comments: Mild discomfort to palpation to the right buttocks area no bruising no swelling.  No pain at the hip.  Skin:    General: Skin is warm and dry.     Capillary Refill: Capillary refill takes less than 2 seconds.  Neurological:     General: No focal deficit present.     Mental Status: He is alert and oriented to person, place, and time.     Cranial Nerves: No cranial nerve deficit.     Sensory: No sensory deficit.     Motor: No weakness.      ED Treatments / Results  Labs (all labs ordered are listed, but only abnormal results are displayed) Labs Reviewed - No data to display  EKG None  Radiology Ct Head Wo Contrast  Result Date: 05/14/2019 CLINICAL DATA:  Hit head against wall with transient altered mental  status/concussive episode EXAM: CT HEAD WITHOUT CONTRAST TECHNIQUE: Contiguous axial images were obtained from the base of the skull through the vertex without intravenous contrast. COMPARISON:  None. FINDINGS: Brain: The ventricles are normal in size and configuration. There is no intracranial mass, hemorrhage, extra-axial fluid collection, or midline shift. The brain parenchyma appears unremarkable. There is no demonstrable acute infarct. Vascular: There is no hyperdense vessel. No vascular calcifications are evident. Skull: The bony calvarium appears intact. Sinuses/Orbits: There is mucosal thickening and opacification in several ethmoid air cells bilaterally. Other visualized paranasal sinuses are  clear. Orbits appear symmetric bilaterally. Other: Visualized mastoid air cells are clear. IMPRESSION: Foci of ethmoid sinus disease bilaterally. Study otherwise unremarkable. Electronically Signed   By: Lowella Grip III M.D.   On: 05/14/2019 16:18    Procedures Procedures (including critical care time)  Medications Ordered in ED Medications - No data to display   Initial Impression / Assessment and Plan / ED Course  I have reviewed the triage vital signs and the nursing notes.  Pertinent labs & imaging results that were available during my care of the patient were reviewed by me and considered in my medical decision making (see chart for details).        Clinically patient has symptoms consistent with mild concussion.  Head CT will be done to rule out any serious injury since this occurred on the job.  Patient without any neck tenderness to palpation posteriorly and full range of motion.  Clinically not concerned about any neck injury.  Also no lumbar tenderness, no redness or bruising to the right buttocks no swelling, no pain with range of motion of the right hip.  Not concerned about any bony injury in this area.       Final Clinical Impressions(s) / ED Diagnoses   Final  diagnoses:  Minor head injury, initial encounter  Contusion of scalp, initial encounter  Concussion without loss of consciousness, initial encounter    ED Discharge Orders    None       Fredia Sorrow, MD 05/14/19 1628    Fredia Sorrow, MD 05/14/19 [phone]

## 2019-05-14 NOTE — Discharge Instructions (Addendum)
Head CT without any acute injury.  No skull fracture no brain injury.  But clinically symptoms consistent with probable concussion.  The symptoms do not resolve over the next few days.  Follow-up with your doctor.  Return for any new or worse symptoms.

## 2019-05-14 NOTE — ED Triage Notes (Signed)
Pt states he tripped/fell ~2hours PTA-struck head on drywall-reports he did not have LOC but felt like he "could not figure out where I was at or what I was doing" lasted 36min-30 min-felt back to baseline 47min-1hour-tp NAD-steady gait

## 2019-07-06 ENCOUNTER — Other Ambulatory Visit: Payer: Self-pay | Admitting: Physician Assistant

## 2019-08-04 ENCOUNTER — Other Ambulatory Visit: Payer: Self-pay | Admitting: Physician Assistant

## 2019-09-18 ENCOUNTER — Other Ambulatory Visit: Payer: Self-pay | Admitting: Physician Assistant

## 2019-10-21 ENCOUNTER — Other Ambulatory Visit: Payer: Self-pay | Admitting: Physician Assistant

## 2019-11-16 ENCOUNTER — Other Ambulatory Visit: Payer: Self-pay | Admitting: Physician Assistant

## 2019-11-26 ENCOUNTER — Other Ambulatory Visit: Payer: Self-pay

## 2019-11-26 NOTE — Telephone Encounter (Signed)
This is a A-Fib clinic pt 

## 2019-11-29 ENCOUNTER — Other Ambulatory Visit: Payer: Self-pay

## 2019-11-29 ENCOUNTER — Ambulatory Visit (HOSPITAL_COMMUNITY)
Admission: RE | Admit: 2019-11-29 | Discharge: 2019-11-29 | Disposition: A | Discharge status: Home / Self Care | Payer: 59 | Source: Ambulatory Visit | Attending: Nurse Practitioner | Admitting: Nurse Practitioner

## 2019-11-29 DIAGNOSIS — E669 Obesity, unspecified: Secondary | ICD-10-CM | POA: Diagnosis not present

## 2019-11-29 DIAGNOSIS — Z8249 Family history of ischemic heart disease and other diseases of the circulatory system: Secondary | ICD-10-CM | POA: Insufficient documentation

## 2019-11-29 DIAGNOSIS — Z6834 Body mass index (BMI) 34.0-34.9, adult: Secondary | ICD-10-CM | POA: Insufficient documentation

## 2019-11-29 DIAGNOSIS — I480 Paroxysmal atrial fibrillation: Secondary | ICD-10-CM | POA: Diagnosis not present

## 2019-11-29 DIAGNOSIS — Z801 Family history of malignant neoplasm of trachea, bronchus and lung: Secondary | ICD-10-CM | POA: Diagnosis not present

## 2019-11-29 DIAGNOSIS — Z79899 Other long term (current) drug therapy: Secondary | ICD-10-CM | POA: Diagnosis not present

## 2019-11-29 DIAGNOSIS — Z880 Allergy status to penicillin: Secondary | ICD-10-CM | POA: Insufficient documentation

## 2019-11-29 DIAGNOSIS — I4819 Other persistent atrial fibrillation: Secondary | ICD-10-CM | POA: Insufficient documentation

## 2019-11-29 DIAGNOSIS — Z833 Family history of diabetes mellitus: Secondary | ICD-10-CM | POA: Diagnosis not present

## 2019-11-29 DIAGNOSIS — I4891 Unspecified atrial fibrillation: Secondary | ICD-10-CM | POA: Diagnosis present

## 2019-11-29 DIAGNOSIS — I4892 Unspecified atrial flutter: Secondary | ICD-10-CM | POA: Diagnosis not present

## 2019-11-29 DIAGNOSIS — Z7982 Long term (current) use of aspirin: Secondary | ICD-10-CM | POA: Diagnosis not present

## 2019-11-29 MED ORDER — DILTIAZEM HCL ER COATED BEADS 300 MG PO CP24: 300.0000 mg | ORAL_CAPSULE | Freq: Every day | ORAL | 2 refills | Status: DC

## 2019-11-29 MED ORDER — FLECAINIDE ACETATE 100 MG PO TABS: 100.0000 mg | ORAL_TABLET | Freq: Two times a day (BID) | ORAL | 3 refills | Status: DC

## 2019-11-29 NOTE — Progress Notes (Signed)
Primary Care Physician: Gweneth Dimitri, MD Primary Cardiologist: Dr Mayford Knife Referring Physician: Dr Legrand Pitts is a 64 y.o. male with a history of paroxysmal atrial fibrillation who presents for follow up in the [facility]. He was diagnosed atrial fibrillation several years ago and done well on flecainide therapy. He reports that he only has episodes 1-2 times per year and these episodes only last for a few minutes. He is now s/p TEE/DCCV on 08/19/18.  On follow up today, patient reports that he continues to do well. He denies heart racing or palpitations. He is tolerating the medication without difficulty.   Today, he denies symptoms of palpitations, chest pain, orthopnea, PND, lower extremity edema, dizziness, presyncope, syncope, bleeding, or neurologic sequela. The patient is tolerating medications without difficulties and is otherwise without complaint today.    Atrial Fibrillation Risk Factors:  he does have symptoms or diagnosis of sleep apnea.  He is not on CPAP therapy. he does not have a history of rheumatic fever. he does not have a history of alcohol use.  he has a BMI of Body mass index is 34.03 kg/m.Marland Kitchen Filed Weights   11/29/19 1521  Weight: 110.7 kg     Atrial Fibrillation Management history:  Previous antiarrhythmic drugs: flecainide Previous cardioversions: 02/23/12, 08/19/18 Previous ablations: none CHADS2VASC score: 0 Anticoagulation history: Xarelto post DCCV  Past Medical History:  Diagnosis Date  . Abnormal blood test 02/24/2012  . Atrial fibrillation (HCC)   . Shingles    X3   Past Surgical History:  Procedure Laterality Date  . CARDIOVERSION N/A 08/19/2018   Procedure: CARDIOVERSION;  Surgeon: Jake Bathe, MD;  Location: [facility] ENDOSCOPY;  Service: Cardiovascular;  Laterality: N/A;  . FINGER FRACTURE SURGERY    . KNEE ARTHROSCOPY Left   . TEE WITHOUT CARDIOVERSION N/A 08/19/2018   Procedure: TRANSESOPHAGEAL  ECHOCARDIOGRAM (TEE);  Surgeon: Jake Bathe, MD;  Location: [facility] ENDOSCOPY;  Service: Cardiovascular;  Laterality: N/A;  . TONSILLECTOMY      Current Outpatient Medications  Medication Sig Dispense Refill  . aspirin EC 81 MG tablet Take 81 mg by mouth daily.    Marland Kitchen atorvastatin (LIPITOR) 10 MG tablet Take 10 mg by mouth daily.  0  . diltiazem (CARDIZEM CD) 300 MG 24 hr capsule Take 1 capsule (300 mg total) by mouth daily. 90 capsule 2  . flecainide (TAMBOCOR) 100 MG tablet Take 1 tablet (100 mg total) by mouth every 12 (twelve) hours. 180 tablet 3  . fluticasone (FLONASE) 50 MCG/ACT nasal spray Place 1 spray into both nostrils daily as needed for allergies.     . Glucosamine-Chondroitin (GLUCOSAMINE CHONDR COMPLEX PO) Take 1 tablet by mouth daily.      No current facility-administered medications for this encounter.    Allergies  Allergen Reactions  . Penicillins Other (See Comments)    Childhood allergy, almost died    Social History   Socioeconomic History  . Marital status: Married    Spouse name: Not on file  . Number of children: Not on file  . Years of education: Not on file  . Highest education level: Not on file  Occupational History    Employer: [employer]  Tobacco Use  . Smoking status: Never Smoker  . Smokeless tobacco: Never Used  Substance and Sexual Activity  . Alcohol use: No  . Drug use: No  . Sexual activity: Not on file  Other Topics Concern  . Not on  file  Social History Narrative  . Not on file   Social Determinants of Health   Financial Resource Strain:   . Difficulty of Paying Living Expenses:   Food Insecurity:   . Worried About Programme researcher, broadcasting/film/video in the Last Year:   . Barista in the Last Year:   Transportation Needs:   . Freight forwarder (Medical):   Marland Kitchen Lack of Transportation (Non-Medical):   Physical Activity:   . Days of Exercise per Week:   . Minutes of Exercise per Session:   Stress:   . Feeling of Stress :    Social Connections:   . Frequency of Communication with Friends and Family:   . Frequency of Social Gatherings with Friends and Family:   . Attends Religious Services:   . Active Member of Clubs or Organizations:   . Attends Banker Meetings:   Marland Kitchen Marital Status:   Intimate Partner Violence:   . Fear of Current or Ex-Partner:   . Emotionally Abused:   Marland Kitchen Physically Abused:   . Sexually Abused:     Family History  Problem Relation Age of Onset  . Heart disease Mother   . Diabetes Mother   . Cancer - Lung Father   . Cancer - Other Sister    The patient does have a history of early familial atrial fibrillation or other arrhythmias.  ROS- All systems are reviewed and negative except as per the HPI above.  Physical Exam: Vitals:   11/29/19 1521  BP: 138/86  Pulse: 65  Weight: 110.7 kg  Height: 5\' 11"  (1.803 m)    GEN- The patient is well appearing obese male, alert and oriented x 3 today.   HEENT-head normocephalic, atraumatic, sclera clear, conjunctiva pink, hearing intact, trachea midline. Lungs- Clear to ausculation bilaterally, normal work of breathing Heart- Regular rate and rhythm, no murmurs, rubs or gallops  GI- soft, NT, ND, + BS Extremities- no clubbing, cyanosis, or edema MS- no significant deformity or atrophy Skin- no rash or lesion Psych- euthymic mood, full affect Neuro- strength and sensation are intact   Wt Readings from Last 3 Encounters:  11/29/19 110.7 kg  05/14/19 108.9 kg  03/11/19 104.8 kg    EKG today demonstrates SR HR 65, LAD, inc LBBB, PR 208, QRS 106, QTc 445  Echo 02/2012 demonstrated  Left ventricle: The cavity size was normal. Wall thickness was increased in a pattern of moderate LVH. Systolic function was normal. The estimated ejection fraction was in the range of 55% to 65%. Wall motion was normal; there were no regional wall motion abnormalities. Left ventricular diastolic function parameters were  normal. - Aortic valve: Trivial regurgitation. - Mitral valve: Mild regurgitation. - Atrial septum: No defect or patent foramen ovale was identified.  Epic records are reviewed at length today  Assessment and Plan:  1. Persisent atrial fibrillation/atrial flutter S/p TEE/DCCV on 08/19/18. Patient appears to be maintaining SR. Continue diltiazem 300 mg daily  Continue flecainide 100 mg BID  This patients CHA2DS2-VASc Score and unadjusted Ischemic Stroke Rate (% per year) is equal to 0.2 % stroke rate/year from a score of 0  Above score calculated as 1 point each if present [CHF, HTN, DM, Vascular=MI/PAD/Aortic Plaque, Age if 65-74, or Male] Above score calculated as 2 points each if present [Age > 75, or Stroke/TIA/TE]  2. Obesity Body mass index is 34.03 kg/m. Lifestyle modification was discussed and encouraged including regular physical activity and weight reduction.  Follow up in the AF clinic in 6 months.    Audry Pili Unice Vantassel PA-C 11/29/2019 4:12 PM

## 2020-04-19 ENCOUNTER — Other Ambulatory Visit: Payer: Self-pay | Admitting: Cardiology

## 2020-04-19 DIAGNOSIS — I480 Paroxysmal atrial fibrillation: Secondary | ICD-10-CM

## 2020-04-19 NOTE — Telephone Encounter (Signed)
This is a A-Fib clinic pt 

## 2020-05-31 ENCOUNTER — Ambulatory Visit (HOSPITAL_COMMUNITY)
Admission: RE | Admit: 2020-05-31 | Discharge: 2020-05-31 | Disposition: A | Discharge status: Home / Self Care | Payer: 59 | Source: Ambulatory Visit | Attending: Physician Assistant | Admitting: Physician Assistant

## 2020-05-31 ENCOUNTER — Encounter (HOSPITAL_COMMUNITY): Payer: Self-pay | Admitting: Physician Assistant

## 2020-05-31 ENCOUNTER — Other Ambulatory Visit: Payer: Self-pay

## 2020-05-31 VITALS — BP 130/80 | HR 58 | Ht 71.0 in | Wt 241.2 lb

## 2020-05-31 DIAGNOSIS — E669 Obesity, unspecified: Secondary | ICD-10-CM | POA: Insufficient documentation

## 2020-05-31 DIAGNOSIS — I4819 Other persistent atrial fibrillation: Secondary | ICD-10-CM | POA: Insufficient documentation

## 2020-05-31 DIAGNOSIS — Z6833 Body mass index (BMI) 33.0-33.9, adult: Secondary | ICD-10-CM | POA: Insufficient documentation

## 2020-05-31 DIAGNOSIS — I4892 Unspecified atrial flutter: Secondary | ICD-10-CM | POA: Diagnosis not present

## 2020-05-31 NOTE — Progress Notes (Signed)
Primary Care Physician: Gweneth Dimitri, MD Primary Cardiologist: Dr Mayford Knife Referring Physician: Dr Legrand Pitts is a 64 y.o. male with a history of paroxysmal atrial fibrillation who presents for follow up in the [facility]. He was diagnosed atrial fibrillation several years ago and done well on flecainide therapy. He reports that he only has episodes 1-2 times per year and these episodes only last for a few minutes. He is now s/p TEE/DCCV on 08/19/18.  On follow up today, patient reports he has done very well since his last visit. He denies any heart racing or palpitations. He is not currently on anticoagulation.   Today, he denies symptoms of palpitations, chest pain, orthopnea, PND, lower extremity edema, dizziness, presyncope, syncope, bleeding, or neurologic sequela. The patient is tolerating medications without difficulties and is otherwise without complaint today.    Atrial Fibrillation Risk Factors:  he does have symptoms or diagnosis of sleep apnea.  He is not on CPAP therapy. he does not have a history of rheumatic fever. he does not have a history of alcohol use.  he has a BMI of Body mass index is 33.64 kg/m.Marland Kitchen Filed Weights   05/31/20 1032  Weight: 109.4 kg     Atrial Fibrillation Management history:  Previous antiarrhythmic drugs: flecainide Previous cardioversions: 02/23/12, 08/19/18 Previous ablations: none CHADS2VASC score: 0 Anticoagulation history: Xarelto post DCCV  Past Medical History:  Diagnosis Date  . Abnormal blood test 02/24/2012  . Atrial fibrillation (HCC)   . Shingles    X3   Past Surgical History:  Procedure Laterality Date  . CARDIOVERSION N/A 08/19/2018   Procedure: CARDIOVERSION;  Surgeon: Jake Bathe, MD;  Location: [facility] ENDOSCOPY;  Service: Cardiovascular;  Laterality: N/A;  . FINGER FRACTURE SURGERY    . KNEE ARTHROSCOPY Left   . TEE WITHOUT CARDIOVERSION N/A 08/19/2018   Procedure:  TRANSESOPHAGEAL ECHOCARDIOGRAM (TEE);  Surgeon: Jake Bathe, MD;  Location: [facility] ENDOSCOPY;  Service: Cardiovascular;  Laterality: N/A;  . TONSILLECTOMY      Current Outpatient Medications  Medication Sig Dispense Refill  . aspirin EC 81 MG tablet Take 81 mg by mouth daily.    Marland Kitchen atorvastatin (LIPITOR) 10 MG tablet Take 10 mg by mouth daily.  0  . diltiazem (CARDIZEM CD) 300 MG 24 hr capsule Take 1 capsule (300 mg total) by mouth daily. 90 capsule 2  . flecainide (TAMBOCOR) 100 MG tablet TAKE 1 TABLET(100 MG) BY MOUTH EVERY 12 HOURS 180 tablet 3  . fluticasone (FLONASE) 50 MCG/ACT nasal spray Place 1 spray into both nostrils daily as needed for allergies.     . Glucosamine-Chondroitin (GLUCOSAMINE CHONDR COMPLEX PO) Take 1 tablet by mouth daily.      No current facility-administered medications for this encounter.    Allergies  Allergen Reactions  . Penicillins Other (See Comments)    Childhood allergy, almost died    Social History   Socioeconomic History  . Marital status: Married    Spouse name: Not on file  . Number of children: Not on file  . Years of education: Not on file  . Highest education level: Not on file  Occupational History    Employer: [employer]  Tobacco Use  . Smoking status: Never Smoker  . Smokeless tobacco: Never Used  Vaping Use  . Vaping Use: Never used  Substance and Sexual Activity  . Alcohol use: No  . Drug use: No  . Sexual activity: Not on  file  Other Topics Concern  . Not on file  Social History Narrative  . Not on file   Social Determinants of Health   Financial Resource Strain:   . Difficulty of Paying Living Expenses: Not on file  Food Insecurity:   . Worried About Programme researcher, broadcasting/film/video in the Last Year: Not on file  . Ran Out of Food in the Last Year: Not on file  Transportation Needs:   . Lack of Transportation (Medical): Not on file  . Lack of Transportation (Non-Medical): Not on file  Physical Activity:   . Days  of Exercise per Week: Not on file  . Minutes of Exercise per Session: Not on file  Stress:   . Feeling of Stress : Not on file  Social Connections:   . Frequency of Communication with Friends and Family: Not on file  . Frequency of Social Gatherings with Friends and Family: Not on file  . Attends Religious Services: Not on file  . Active Member of Clubs or Organizations: Not on file  . Attends Banker Meetings: Not on file  . Marital Status: Not on file  Intimate Partner Violence:   . Fear of Current or Ex-Partner: Not on file  . Emotionally Abused: Not on file  . Physically Abused: Not on file  . Sexually Abused: Not on file    Family History  Problem Relation Age of Onset  . Heart disease Mother   . Diabetes Mother   . Cancer - Lung Father   . Cancer - Other Sister    The patient does have a history of early familial atrial fibrillation or other arrhythmias.  ROS- All systems are reviewed and negative except as per the HPI above.  Physical Exam: Vitals:   05/31/20 1032  BP: 130/80  Pulse: (!) 58  Weight: 109.4 kg  Height: 5\' 11"  (1.803 m)    GEN- The patient is well appearing obese male, alert and oriented x 3 today.   HEENT-head normocephalic, atraumatic, sclera clear, conjunctiva pink, hearing intact, trachea midline. Lungs- Clear to ausculation bilaterally, normal work of breathing Heart- Regular rate and rhythm, no murmurs, rubs or gallops  GI- soft, NT, ND, + BS Extremities- no clubbing, cyanosis, or edema MS- no significant deformity or atrophy Skin- no rash or lesion Psych- euthymic mood, full affect Neuro- strength and sensation are intact   Wt Readings from Last 3 Encounters:  05/31/20 109.4 kg  11/29/19 110.7 kg  05/14/19 108.9 kg    EKG today demonstrates SB HR 58, 1st degree AV block, LAFB, PR 220, QRS 114, QTc 428  Echo 02/2012 demonstrated  Left ventricle: The cavity size was normal. Wall thickness was increased in a pattern of  moderate LVH. Systolic function was normal. The estimated ejection fraction was in the range of 55% to 65%. Wall motion was normal; there were no regional wall motion abnormalities. Left ventricular diastolic function parameters were normal. - Aortic valve: Trivial regurgitation. - Mitral valve: Mild regurgitation. - Atrial septum: No defect or patent foramen ovale was identified.  Epic records are reviewed at length today  Assessment and Plan:  1. Persisent atrial fibrillation/atrial flutter Patient appears to be maintaining SR. No symptoms of afib. Continue diltiazem 300 mg daily  Continue flecainide 100 mg BID  This patients CHA2DS2-VASc Score and unadjusted Ischemic Stroke Rate (% per year) is equal to 0.2 % stroke rate/year from a score of 0  Above score calculated as 1 point each if present [  CHF, HTN, DM, Vascular=MI/PAD/Aortic Plaque, Age if 65-74, or Male] Above score calculated as 2 points each if present [Age > 75, or Stroke/TIA/TE]  2. Obesity Body mass index is 33.64 kg/m. Lifestyle modification was discussed and encouraged including regular physical activity and weight reduction.   Follow up in the AF clinic in 6 months.    [name] PA-C [facility] [address] [place], [place] [address] [phone] 05/31/2020 10:41 AM

## 2020-08-14 ENCOUNTER — Other Ambulatory Visit (HOSPITAL_COMMUNITY): Payer: Self-pay

## 2020-08-14 MED ORDER — DILTIAZEM HCL ER COATED BEADS 300 MG PO CP24: 300.0000 mg | ORAL_CAPSULE | Freq: Every day | ORAL | 2 refills | Status: DC

## 2020-08-24 ENCOUNTER — Other Ambulatory Visit (HOSPITAL_COMMUNITY): Payer: Self-pay | Admitting: *Deleted

## 2020-08-24 MED ORDER — DILTIAZEM HCL ER COATED BEADS 300 MG PO CP24: 300.0000 mg | ORAL_CAPSULE | Freq: Every day | ORAL | 2 refills | Status: DC

## 2020-11-29 ENCOUNTER — Ambulatory Visit (HOSPITAL_COMMUNITY)
Admission: RE | Admit: 2020-11-29 | Discharge: 2020-11-29 | Disposition: A | Discharge status: Home / Self Care | Payer: 59 | Source: Ambulatory Visit | Attending: Physician Assistant | Admitting: Physician Assistant

## 2020-11-29 ENCOUNTER — Encounter (HOSPITAL_COMMUNITY): Payer: Self-pay | Admitting: Physician Assistant

## 2020-11-29 ENCOUNTER — Other Ambulatory Visit: Payer: Self-pay

## 2020-11-29 VITALS — BP 110/82 | HR 63 | Ht 71.0 in | Wt 240.2 lb

## 2020-11-29 DIAGNOSIS — Z6833 Body mass index (BMI) 33.0-33.9, adult: Secondary | ICD-10-CM | POA: Insufficient documentation

## 2020-11-29 DIAGNOSIS — E669 Obesity, unspecified: Secondary | ICD-10-CM | POA: Diagnosis not present

## 2020-11-29 DIAGNOSIS — Z7182 Exercise counseling: Secondary | ICD-10-CM | POA: Insufficient documentation

## 2020-11-29 DIAGNOSIS — Z7901 Long term (current) use of anticoagulants: Secondary | ICD-10-CM | POA: Diagnosis not present

## 2020-11-29 DIAGNOSIS — Z8616 Personal history of COVID-19: Secondary | ICD-10-CM | POA: Diagnosis not present

## 2020-11-29 DIAGNOSIS — Z7982 Long term (current) use of aspirin: Secondary | ICD-10-CM | POA: Insufficient documentation

## 2020-11-29 DIAGNOSIS — Z79899 Other long term (current) drug therapy: Secondary | ICD-10-CM | POA: Insufficient documentation

## 2020-11-29 DIAGNOSIS — I4819 Other persistent atrial fibrillation: Secondary | ICD-10-CM | POA: Insufficient documentation

## 2020-11-29 DIAGNOSIS — Z880 Allergy status to penicillin: Secondary | ICD-10-CM | POA: Insufficient documentation

## 2020-11-29 NOTE — Progress Notes (Signed)
Primary Care Physician: Gweneth Dimitri, MD Primary Cardiologist: Dr Mayford Knife Referring Physician: Dr Legrand Pitts is a 65 y.o. male with a history of paroxysmal atrial fibrillation who presents for follow up in the [facility]. He was diagnosed atrial fibrillation several years ago and done well on flecainide therapy. He reports that he only has episodes 1-2 times per year and these episodes only last for a few minutes. He is now s/p TEE/DCCV on 08/19/18.  On follow up today, patient reports he has done very well since his last visit. He denies any heart racing or palpitations. Of note, he was diagnosed with COVID a few weeks ago and has now recovered.   Today, he denies symptoms of palpitations, chest pain, orthopnea, PND, lower extremity edema, dizziness, presyncope, syncope, bleeding, or neurologic sequela. The patient is tolerating medications without difficulties and is otherwise without complaint today.    Atrial Fibrillation Risk Factors:  he does have symptoms or diagnosis of sleep apnea.  He is not on CPAP therapy. he does not have a history of rheumatic fever. he does not have a history of alcohol use.  he has a BMI of Body mass index is 33.5 kg/m.Marland Kitchen Filed Weights   11/29/20 1024  Weight: 109 kg     Atrial Fibrillation Management history:  Previous antiarrhythmic drugs: flecainide Previous cardioversions: 02/23/12, 08/19/18 Previous ablations: none CHADS2VASC score: 0 Anticoagulation history: Xarelto post DCCV  Past Medical History:  Diagnosis Date  . Abnormal blood test 02/24/2012  . Atrial fibrillation (HCC)   . Shingles    X3   Past Surgical History:  Procedure Laterality Date  . CARDIOVERSION N/A 08/19/2018   Procedure: CARDIOVERSION;  Surgeon: Jake Bathe, MD;  Location: [facility] ENDOSCOPY;  Service: Cardiovascular;  Laterality: N/A;  . FINGER FRACTURE SURGERY    . KNEE ARTHROSCOPY Left   . TEE WITHOUT CARDIOVERSION N/A  08/19/2018   Procedure: TRANSESOPHAGEAL ECHOCARDIOGRAM (TEE);  Surgeon: Jake Bathe, MD;  Location: [facility] ENDOSCOPY;  Service: Cardiovascular;  Laterality: N/A;  . TONSILLECTOMY      Current Outpatient Medications  Medication Sig Dispense Refill  . aspirin EC 81 MG tablet Take 81 mg by mouth daily.    Marland Kitchen atorvastatin (LIPITOR) 10 MG tablet Take 10 mg by mouth daily.  0  . diltiazem (CARDIZEM CD) 300 MG 24 hr capsule Take 1 capsule (300 mg total) by mouth daily. 90 capsule 2  . flecainide (TAMBOCOR) 100 MG tablet TAKE 1 TABLET(100 MG) BY MOUTH EVERY 12 HOURS 180 tablet 3  . fluticasone (FLONASE) 50 MCG/ACT nasal spray Place 1 spray into both nostrils daily as needed for allergies.     . Glucosamine-Chondroitin (GLUCOSAMINE CHONDR COMPLEX PO) Take 1 tablet by mouth daily.      No current facility-administered medications for this encounter.    Allergies  Allergen Reactions  . Penicillins Other (See Comments)    Childhood allergy, almost died  . Simvastatin     Other reaction(s): AFIB    Social History   Socioeconomic History  . Marital status: Married    Spouse name: Not on file  . Number of children: Not on file  . Years of education: Not on file  . Highest education level: Not on file  Occupational History    Employer: [employer]  Tobacco Use  . Smoking status: Never Smoker  . Smokeless tobacco: Never Used  Vaping Use  . Vaping Use: Never used  Substance  and Sexual Activity  . Alcohol use: No  . Drug use: No  . Sexual activity: Not on file  Other Topics Concern  . Not on file  Social History Narrative  . Not on file   Social Determinants of Health   Financial Resource Strain: Not on file  Food Insecurity: Not on file  Transportation Needs: Not on file  Physical Activity: Not on file  Stress: Not on file  Social Connections: Not on file  Intimate Partner Violence: Not on file    Family History  Problem Relation Age of Onset  . Heart disease  Mother   . Diabetes Mother   . Cancer - Lung Father   . Cancer - Other Sister    The patient does have a history of early familial atrial fibrillation or other arrhythmias.  ROS- All systems are reviewed and negative except as per the HPI above.  Physical Exam: Vitals:   11/29/20 1024  BP: 110/82  Pulse: 63  Weight: 109 kg  Height: 5\' 11"  (1.803 m)    GEN- The patient is a well appearing obese male, alert and oriented x 3 today.   HEENT-head normocephalic, atraumatic, sclera clear, conjunctiva pink, hearing intact, trachea midline. Lungs- Clear to ausculation bilaterally, normal work of breathing Heart- Regular rate and rhythm, no murmurs, rubs or gallops  GI- soft, NT, ND, + BS Extremities- no clubbing, cyanosis, or edema MS- no significant deformity or atrophy Skin- no rash or lesion Psych- euthymic mood, full affect Neuro- strength and sensation are intact   Wt Readings from Last 3 Encounters:  11/29/20 109 kg  05/31/20 109.4 kg  11/29/19 110.7 kg    EKG today demonstrates  SR, 1st degree AV block Vent. rate 63 BPM PR interval 224 ms QRS duration 106 ms QT/QTcB 436/446 ms  Echo 02/2012 demonstrated  Left ventricle: The cavity size was normal. Wall thickness was increased in a pattern of moderate LVH. Systolic function was normal. The estimated ejection fraction was in the range of 55% to 65%. Wall motion was normal; there were no regional wall motion abnormalities. Left ventricular diastolic function parameters were normal. - Aortic valve: Trivial regurgitation. - Mitral valve: Mild regurgitation. - Atrial septum: No defect or patent foramen ovale was identified.  TEE 08/19/18 - Left ventricle: Systolic function was normal. The estimated  ejection fraction was in the range of 60% to 65%. Wall motion was  normal; there were no regional wall motion abnormalities. No  evidence of thrombus.  - Aortic valve: No evidence of vegetation. There  was trivial  regurgitation.  - Mitral valve: No evidence of vegetation. There was mild  regurgitation.  - Left atrium: No evidence of thrombus in the atrial cavity or  appendage. No evidence of thrombus in the atrial cavity or  appendage. No evidence of thrombus in the appendage.  - Right atrium: No evidence of thrombus in the atrial cavity or  appendage.  - Atrial septum: There was a patent foramen ovale. Doppler and  agitated saline contrast study showed a small right-to-left shunt  through a patent foramen ovale, in the baseline state.  - Tricuspid valve: No evidence of vegetation.  - Pulmonic valve: No evidence of vegetation.   Impressions:   - Successful cardioversion. No cardiac source of emboli was  indentified.   Epic records are reviewed at length today  CHA2DS2-VASc Score = 0  The patient's score is based upon: CHF History: No HTN History: No Diabetes History: No Stroke History: No  Vascular Disease History: No Age Score: 0 Gender Score: 0      ASSESSMENT AND PLAN: 1. Persistent Atrial Fibrillation/atrial flutter The patient's CHA2DS2-VASc score is 0, indicating a 0.2% annual risk of stroke.   Patient appears to be maintaining SR. Continue diltiazem 300 mg daily  Continue flecainide 100 mg BID No anticoagulation indicated at this time.   2. Obesity Body mass index is 33.5 kg/m. Lifestyle modification was discussed and encouraged including regular physical activity and weight reduction.   Follow up in the AF clinic in 6 months.    [name] PA-C Afib Clinic [facility] [address] [place], [place] [address] [phone] 11/29/2020 10:30 AM

## 2021-05-09 ENCOUNTER — Other Ambulatory Visit (HOSPITAL_COMMUNITY): Payer: Self-pay

## 2021-05-09 MED ORDER — DILTIAZEM HCL ER COATED BEADS 300 MG PO CP24: 300.0000 mg | ORAL_CAPSULE | Freq: Every day | ORAL | 2 refills | Status: DC

## 2021-05-15 ENCOUNTER — Other Ambulatory Visit (HOSPITAL_COMMUNITY): Payer: Self-pay

## 2021-05-15 ENCOUNTER — Telehealth (HOSPITAL_COMMUNITY): Payer: Self-pay | Admitting: *Deleted

## 2021-05-15 ENCOUNTER — Emergency Department (HOSPITAL_COMMUNITY): Payer: 59

## 2021-05-15 ENCOUNTER — Emergency Department (HOSPITAL_COMMUNITY)
Admission: EM | Admit: 2021-05-15 | Discharge: 2021-05-15 | Disposition: A | Discharge status: Home / Self Care | Payer: 59 | Attending: Emergency Medicine | Admitting: Emergency Medicine

## 2021-05-15 DIAGNOSIS — I4891 Unspecified atrial fibrillation: Secondary | ICD-10-CM | POA: Insufficient documentation

## 2021-05-15 DIAGNOSIS — Z5321 Procedure and treatment not carried out due to patient leaving prior to being seen by health care provider: Secondary | ICD-10-CM | POA: Insufficient documentation

## 2021-05-15 DIAGNOSIS — R0602 Shortness of breath: Secondary | ICD-10-CM | POA: Diagnosis present

## 2021-05-15 LAB — CBC WITH DIFFERENTIAL/PLATELET
Abs Immature Granulocytes: 0.01 10*3/uL (ref 0.00–0.07)
Basophils Absolute: 0.1 10*3/uL (ref 0.0–0.1)
Basophils Relative: 1 %
Eosinophils Absolute: 0.2 10*3/uL (ref 0.0–0.5)
Eosinophils Relative: 4 %
HCT: 44.9 % (ref 39.0–52.0)
Hemoglobin: 14.7 g/dL (ref 13.0–17.0)
Immature Granulocytes: 0 %
Lymphocytes Relative: 30 %
Lymphs Abs: 1.8 10*3/uL (ref 0.7–4.0)
MCH: 30.1 pg (ref 26.0–34.0)
MCHC: 32.7 g/dL (ref 30.0–36.0)
MCV: 91.8 fL (ref 80.0–100.0)
Monocytes Absolute: 0.5 10*3/uL (ref 0.1–1.0)
Monocytes Relative: 8 %
Neutro Abs: 3.6 10*3/uL (ref 1.7–7.7)
Neutrophils Relative %: 57 %
Platelets: 191 10*3/uL (ref 150–400)
RBC: 4.89 MIL/uL (ref 4.22–5.81)
RDW: 12 % (ref 11.5–15.5)
WBC: 6.2 10*3/uL (ref 4.0–10.5)
nRBC: 0 % (ref 0.0–0.2)

## 2021-05-15 LAB — BASIC METABOLIC PANEL
Anion gap: 9 (ref 5–15)
BUN: 18 mg/dL (ref 8–23)
CO2: 24 mmol/L (ref 22–32)
Calcium: 9 mg/dL (ref 8.9–10.3)
Chloride: 105 mmol/L (ref 98–111)
Creatinine, Ser: 1.13 mg/dL (ref 0.61–1.24)
GFR, Estimated: >60 mL/min (ref >60)
Glucose, Bld: 116 mg/dL — ABNORMAL HIGH (ref 70–99)
Potassium: 3.7 mmol/L (ref 3.5–5.1)
Sodium: 138 mmol/L (ref 135–145)

## 2021-05-15 LAB — TROPONIN I (HIGH SENSITIVITY)
Troponin I (High Sensitivity): 8 ng/L (ref <18)
Troponin I (High Sensitivity): 10 ng/L (ref <18)

## 2021-05-15 LAB — BRAIN NATRIURETIC PEPTIDE: B Natriuretic Peptide: 85.5 pg/mL (ref 0.0–100.0)

## 2021-05-15 LAB — MAGNESIUM: Magnesium: 2 mg/dL (ref 1.7–2.4)

## 2021-05-15 MED ORDER — RIVAROXABAN 20 MG PO TABS: 20.0000 mg | ORAL_TABLET | Freq: Every day | ORAL | 1 refills | Status: DC

## 2021-05-15 MED ORDER — RIVAROXABAN 20 MG PO TABS: 20.0000 mg | ORAL_TABLET | Freq: Every day | ORAL | 3 refills | Status: DC

## 2021-05-15 NOTE — ED Provider Notes (Signed)
Emergency Medicine Provider Triage Evaluation Note  Cesar Wheeler , a 65 y.o. male  was evaluated in triage.  Pt complains of AFIB.  Symptoms began around 10 PM, states feels like prior episodes of his A. fib.  History of ablation 2 years ago and only 1 incident since then.  He did take his home flecainide and feels somewhat better but not back to baseline.  Some SOB and lightheadedness.  On baby aspirin, no formal anticoagulation.  Follows in AFIB clinic.  Review of Systems  Positive: AFIB Negative: Nausea, vomiting  Physical Exam  BP (!) 150/104 (BP Location: Right Arm)   Pulse (!) 110   Temp (!) 97.5 F (36.4 C) (Oral)   Resp 17   SpO2 99%  Gen:   Awake, no distress   Resp:  Normal effort  MSK:   Moves extremities without difficulty  Other:    Medical Decision Making  Medically screening exam initiated at 1:34 AM.  Appropriate orders placed.  Yvone Neu was informed that the remainder of the evaluation will be completed by another provider, this initial triage assessment does not replace that evaluation, and the importance of remaining in the ED until their evaluation is complete.  AFIB.  Hx of same, not on anticoagulation.  Has already had his home flecainide.  Remains in A. fib rate of 109 in triage.  Labs, chest x-ray ordered.   Garlon Hatchet, PA-C 05/15/21 0144    Melene Plan, DO 05/15/21 [phone]

## 2021-05-15 NOTE — ED Triage Notes (Signed)
Pt c/o being in afib for approx 4hrs, pt states symptoms felt similar to previous episodes. Hx afib x28yrs, states "hardly no episodes" in same time. Pt advises that he took home flecainide & feels somewhat better, but still endorses SHOB, lightheadedness.

## 2021-05-15 NOTE — Telephone Encounter (Signed)
Pt called from the ER waiting room stating he has been there since little after midnight he initially was symptomatic when he went into afib but now is not. He has decided to leave the ER - per [name] PA will restart Xarelto 20mg  once a day and will follow up once back home for follow up appt. Pt heart rate is right around 100 currently. He will go home and take his morning medications. Pt in agreement with plan.

## 2021-05-16 ENCOUNTER — Encounter (HOSPITAL_COMMUNITY): Payer: Self-pay | Admitting: Cardiovascular Disease

## 2021-05-16 ENCOUNTER — Other Ambulatory Visit: Payer: Self-pay

## 2021-05-16 ENCOUNTER — Ambulatory Visit (HOSPITAL_COMMUNITY)
Admission: RE | Admit: 2021-05-16 | Discharge: 2021-05-16 | Disposition: A | Discharge status: Home / Self Care | Payer: 59 | Source: Ambulatory Visit | Attending: Physician Assistant | Admitting: Physician Assistant

## 2021-05-16 VITALS — BP 110/82 | HR 109 | Ht 71.0 in | Wt 241.8 lb

## 2021-05-16 DIAGNOSIS — Z7982 Long term (current) use of aspirin: Secondary | ICD-10-CM | POA: Insufficient documentation

## 2021-05-16 DIAGNOSIS — I4819 Other persistent atrial fibrillation: Secondary | ICD-10-CM | POA: Diagnosis not present

## 2021-05-16 DIAGNOSIS — Z6833 Body mass index (BMI) 33.0-33.9, adult: Secondary | ICD-10-CM | POA: Insufficient documentation

## 2021-05-16 DIAGNOSIS — Z79899 Other long term (current) drug therapy: Secondary | ICD-10-CM | POA: Diagnosis not present

## 2021-05-16 DIAGNOSIS — I484 Atypical atrial flutter: Secondary | ICD-10-CM

## 2021-05-16 DIAGNOSIS — Z8249 Family history of ischemic heart disease and other diseases of the circulatory system: Secondary | ICD-10-CM | POA: Insufficient documentation

## 2021-05-16 DIAGNOSIS — I4892 Unspecified atrial flutter: Secondary | ICD-10-CM | POA: Insufficient documentation

## 2021-05-16 DIAGNOSIS — E669 Obesity, unspecified: Secondary | ICD-10-CM | POA: Diagnosis not present

## 2021-05-16 DIAGNOSIS — Z7901 Long term (current) use of anticoagulants: Secondary | ICD-10-CM | POA: Diagnosis not present

## 2021-05-16 DIAGNOSIS — Z880 Allergy status to penicillin: Secondary | ICD-10-CM | POA: Insufficient documentation

## 2021-05-16 NOTE — Progress Notes (Signed)
Primary Care Physician: Gweneth Dimitri, MD Primary Cardiologist: Dr Mayford Knife Referring Physician: Dr Legrand Pitts is a 65 y.o. male with a history of paroxysmal atrial fibrillation who presents for follow up in the [facility]. He was diagnosed atrial fibrillation several years ago and done well on flecainide therapy. He reports that he only has episodes 1-2 times per year and these episodes only last for a few minutes. He is now s/p TEE/DCCV on 08/19/18.  On follow up today, patient had done very well from an afib standpoint until 05/14/21 when he noted heart racing and leg weakness as he was getting in bed. He presented to the ED for evaluation but his symptoms abated and he left before being seen. He resumed his Xarelto that same day. He is in atrial flutter today with symptoms of palpitations. There were no specific triggers that he could identify.   Today, he denies symptoms of chest pain, orthopnea, PND, lower extremity edema, dizziness, presyncope, syncope, bleeding, or neurologic sequela. The patient is tolerating medications without difficulties and is otherwise without complaint today.    Atrial Fibrillation Risk Factors:  he does have symptoms or diagnosis of sleep apnea.  He is not on CPAP therapy. he does not have a history of rheumatic fever. he does not have a history of alcohol use.  he has a BMI of Body mass index is 33.72 kg/m.Marland Kitchen Filed Weights   05/16/21 0936  Weight: 109.7 kg      Atrial Fibrillation Management history:  Previous antiarrhythmic drugs: flecainide Previous cardioversions: 02/23/12, 08/19/18 Previous ablations: none CHADS2VASC score: 0 Anticoagulation history: Xarelto post DCCV  Past Medical History:  Diagnosis Date   Abnormal blood test 02/24/2012   Atrial fibrillation (HCC)    Shingles    X3   Past Surgical History:  Procedure Laterality Date   CARDIOVERSION N/A 08/19/2018   Procedure: CARDIOVERSION;   Surgeon: Jake Bathe, MD;  Location: [facility] ENDOSCOPY;  Service: Cardiovascular;  Laterality: N/A;   FINGER FRACTURE SURGERY     KNEE ARTHROSCOPY Left    TEE WITHOUT CARDIOVERSION N/A 08/19/2018   Procedure: TRANSESOPHAGEAL ECHOCARDIOGRAM (TEE);  Surgeon: Jake Bathe, MD;  Location: [facility] ENDOSCOPY;  Service: Cardiovascular;  Laterality: N/A;   TONSILLECTOMY      Current Outpatient Medications  Medication Sig Dispense Refill   aspirin EC 81 MG tablet Take 81 mg by mouth daily.     atorvastatin (LIPITOR) 10 MG tablet Take 10 mg by mouth daily.  0   diltiazem (CARDIZEM CD) 300 MG 24 hr capsule Take 1 capsule (300 mg total) by mouth daily. 90 capsule 2   flecainide (TAMBOCOR) 100 MG tablet TAKE 1 TABLET(100 MG) BY MOUTH EVERY 12 HOURS 180 tablet 3   fluticasone (FLONASE) 50 MCG/ACT nasal spray Place 1 spray into both nostrils daily as needed for allergies.      Glucosamine-Chondroitin (GLUCOSAMINE CHONDR COMPLEX PO) Take 1 tablet by mouth daily.      rivaroxaban (XARELTO) 20 MG TABS tablet Take 1 tablet (20 mg total) by mouth daily with supper. 90 tablet 3   No current facility-administered medications for this encounter.    Allergies  Allergen Reactions   Penicillins Other (See Comments)    Childhood allergy, almost died   Simvastatin     Other reaction(s): AFIB    Social History   Socioeconomic History   Marital status: Married    Spouse name: Not on file   Number  of children: Not on file   Years of education: Not on file   Highest education level: Not on file  Occupational History    Employer: [employer]  Tobacco Use   Smoking status: Never   Smokeless tobacco: Never  Vaping Use   Vaping Use: Never used  Substance and Sexual Activity   Alcohol use: No   Drug use: No   Sexual activity: Not on file  Other Topics Concern   Not on file  Social History Narrative   Not on file   Social Determinants of Health   Financial Resource Strain: Not on file  Food  Insecurity: Not on file  Transportation Needs: Not on file  Physical Activity: Not on file  Stress: Not on file  Social Connections: Not on file  Intimate Partner Violence: Not on file    Family History  Problem Relation Age of Onset   Heart disease Mother    Diabetes Mother    Cancer - Lung Father    Cancer - Other Sister    The patient does have a history of early familial atrial fibrillation or other arrhythmias.  ROS- All systems are reviewed and negative except as per the HPI above.  Physical Exam: Vitals:   05/16/21 0936  BP: 110/82  Pulse: (!) 109  Weight: 109.7 kg  Height: 5\' 11"  (1.803 m)    GEN- The patient is a well appearing obese male, alert and oriented x 3 today.   HEENT-head normocephalic, atraumatic, sclera clear, conjunctiva pink, hearing intact, trachea midline. Lungs- Clear to ausculation bilaterally, normal work of breathing Heart- Regular rate and rhythm, tachycardia, no murmurs, rubs or gallops  GI- soft, NT, ND, + BS Extremities- no clubbing, cyanosis, or edema MS- no significant deformity or atrophy Skin- no rash or lesion Psych- euthymic mood, full affect Neuro- strength and sensation are intact   Wt Readings from Last 3 Encounters:  05/16/21 109.7 kg  11/29/20 109 kg  05/31/20 109.4 kg    EKG today demonstrates  Atypical atrial flutter with 2:1 block Vent. rate 109 BPM PR interval 220 ms QRS duration 102 ms QT/QTcB 348/468 ms  Echo 02/2012 demonstrated  Left ventricle: The cavity size was normal. Wall thickness   was increased in a pattern of moderate LVH. Systolic   function was normal. The estimated ejection fraction was   in the range of 55% to 65%. Wall motion was normal; there   were no regional wall motion abnormalities. Left   ventricular diastolic function parameters were normal. - Aortic valve: Trivial regurgitation. - Mitral valve: Mild regurgitation. - Atrial septum: No defect or patent foramen ovale was    identified.  TEE 08/19/18 - Left ventricle: Systolic function was normal. The estimated    ejection fraction was in the range of 60% to 65%. Wall motion was    normal; there were no regional wall motion abnormalities. No    evidence of thrombus.  - Aortic valve: No evidence of vegetation. There was trivial    regurgitation.  - Mitral valve: No evidence of vegetation. There was mild    regurgitation.  - Left atrium: No evidence of thrombus in the atrial cavity or    appendage. No evidence of thrombus in the atrial cavity or    appendage. No evidence of thrombus in the appendage.  - Right atrium: No evidence of thrombus in the atrial cavity or    appendage.  - Atrial septum: There was a patent foramen ovale. Doppler  and    agitated saline contrast study showed a small right-to-left shunt    through a patent foramen ovale, in the baseline state.  - Tricuspid valve: No evidence of vegetation.  - Pulmonic valve: No evidence of vegetation.   Impressions:   - Successful cardioversion. No cardiac source of emboli was    indentified.   Epic records are reviewed at length today  CHA2DS2-VASc Score = 0  The patient's score is based upon: CHF History: 0 HTN History: 0 Diabetes History: 0 Stroke History: 0 Vascular Disease History: 0 Age Score: 0 Gender Score: 0      ASSESSMENT AND PLAN: 1. Persistent Atrial Fibrillation/atrial flutter The patient's CHA2DS2-VASc score is 0, indicating a 0.2% annual risk of stroke.   Patient in atrial flutter. We discussed therapeutic options today.  Will plan for DCCV. Recent labs reviewed. He will not require TEE as he resumed his Xarelto at the onset of his arrhythmia.  Continue diltiazem 300 mg daily Continue flecainide 100 mg BID Check echocardiogram Long-term, is is interested in possible ablation but would like to wait until next year to discuss.   2. Obesity Body mass index is 33.72 kg/m. Lifestyle modification was discussed and  encouraged including regular physical activity and weight reduction.   Follow up in the AF clinic post DCCV.    [name] PA-C Afib Clinic [facility] [address] [place], [place] [address] [phone] 05/16/2021 9:44 AM

## 2021-05-16 NOTE — Patient Instructions (Signed)
Cardioversion scheduled for Thursday, November 3rd  - Arrive at the [facility] and go to admitting at 12PM  - Do not eat or drink anything after midnight the night prior to your procedure.  - Take all your morning medication (except diabetic medications) with a sip of water prior to arrival.  - You will not be able to drive home after your procedure.  - Do NOT miss any doses of your blood thinner - if you should miss a dose please notify our office immediately.  - If you feel as if you go back into normal rhythm prior to scheduled cardioversion, please notify our office immediately. If your procedure is canceled in the cardioversion suite you will be charged a cancellation fee. Patients will be asked to: to mask in public and hand hygiene (no longer quarantine) in the 3 days prior to surgery, to report if any COVID-19-like illness or household contacts to COVID-19 to determine need for testing

## 2021-05-23 ENCOUNTER — Other Ambulatory Visit: Payer: Self-pay

## 2021-05-23 ENCOUNTER — Ambulatory Visit (HOSPITAL_COMMUNITY)
Admission: RE | Admit: 2021-05-23 | Discharge: 2021-05-23 | Disposition: A | Discharge status: Home / Self Care | Payer: 59 | Source: Ambulatory Visit | Attending: Physician Assistant | Admitting: Physician Assistant

## 2021-05-23 DIAGNOSIS — I4819 Other persistent atrial fibrillation: Secondary | ICD-10-CM | POA: Diagnosis present

## 2021-05-23 NOTE — Progress Notes (Signed)
Patient returns for ECG, he felt he was back in SR. ECG confirms SR, 1st degree AV block, inc LBBB, HR 64, PR 226, QTc 464. Will cancel DCCV. Continue anticoagulation for 4 weeks post spontaneous conversion. F/u in 6 months.

## 2021-05-24 ENCOUNTER — Ambulatory Visit (HOSPITAL_COMMUNITY): Admission: RE | Admit: 2021-05-24 | Payer: 59 | Source: Home / Self Care | Admitting: Cardiovascular Disease

## 2021-05-24 SURGERY — CARDIOVERSION
Anesthesia: General

## 2021-05-29 ENCOUNTER — Other Ambulatory Visit (HOSPITAL_COMMUNITY): Payer: Self-pay | Admitting: *Deleted

## 2021-05-29 DIAGNOSIS — I480 Paroxysmal atrial fibrillation: Secondary | ICD-10-CM

## 2021-05-29 MED ORDER — FLECAINIDE ACETATE 100 MG PO TABS: ORAL_TABLET | ORAL | 1 refills | Status: DC

## 2021-05-30 ENCOUNTER — Ambulatory Visit (HOSPITAL_COMMUNITY): Payer: 59 | Admitting: Physician Assistant

## 2021-05-31 ENCOUNTER — Ambulatory Visit (HOSPITAL_COMMUNITY): Payer: 59 | Admitting: Physician Assistant

## 2021-06-26 ENCOUNTER — Ambulatory Visit (HOSPITAL_COMMUNITY): Payer: 59

## 2021-08-13 ENCOUNTER — Telehealth (HOSPITAL_COMMUNITY): Payer: Self-pay | Admitting: *Deleted

## 2021-08-13 NOTE — Telephone Encounter (Signed)
Patient called in stating he has been back in AF since Friday night. He resumed Xarelto on Saturday Morning - -Currently BP 115/75 HR 113. Appt made.

## 2021-08-14 ENCOUNTER — Ambulatory Visit (HOSPITAL_COMMUNITY)
Admission: RE | Admit: 2021-08-14 | Discharge: 2021-08-14 | Disposition: A | Discharge status: Home / Self Care | Payer: 59 | Source: Ambulatory Visit | Attending: Physician Assistant | Admitting: Physician Assistant

## 2021-08-14 ENCOUNTER — Encounter (HOSPITAL_COMMUNITY): Payer: Self-pay | Admitting: Physician Assistant

## 2021-08-14 ENCOUNTER — Other Ambulatory Visit: Payer: Self-pay

## 2021-08-14 DIAGNOSIS — I4892 Unspecified atrial flutter: Secondary | ICD-10-CM | POA: Diagnosis not present

## 2021-08-14 DIAGNOSIS — Z79899 Other long term (current) drug therapy: Secondary | ICD-10-CM | POA: Diagnosis not present

## 2021-08-14 DIAGNOSIS — E669 Obesity, unspecified: Secondary | ICD-10-CM | POA: Insufficient documentation

## 2021-08-14 DIAGNOSIS — Z6833 Body mass index (BMI) 33.0-33.9, adult: Secondary | ICD-10-CM | POA: Diagnosis not present

## 2021-08-14 DIAGNOSIS — Z7901 Long term (current) use of anticoagulants: Secondary | ICD-10-CM | POA: Diagnosis not present

## 2021-08-14 DIAGNOSIS — I480 Paroxysmal atrial fibrillation: Secondary | ICD-10-CM | POA: Diagnosis not present

## 2021-08-14 DIAGNOSIS — I4819 Other persistent atrial fibrillation: Secondary | ICD-10-CM | POA: Insufficient documentation

## 2021-08-14 MED ORDER — DILTIAZEM HCL ER COATED BEADS 360 MG PO CP24: 360.0000 mg | ORAL_CAPSULE | Freq: Every day | ORAL | 3 refills | Status: DC

## 2021-08-14 MED ORDER — RIVAROXABAN 20 MG PO TABS: 20.0000 mg | ORAL_TABLET | Freq: Every day | ORAL | 3 refills | Status: DC

## 2021-08-14 MED ORDER — FLECAINIDE ACETATE 100 MG PO TABS: ORAL_TABLET | ORAL | 1 refills | Status: DC

## 2021-08-14 NOTE — Progress Notes (Signed)
Primary Care Physician: Gweneth Dimitri, MD Primary Cardiologist: Dr Mayford Knife Referring Physician: Dr Legrand Pitts is a 66 y.o. male with a history of paroxysmal atrial fibrillation who presents for follow up in the [facility]. He was diagnosed atrial fibrillation several years ago and done well on flecainide therapy. He reports that he only has episodes 1-2 times per year and these episodes only last for a few minutes. He is now s/p TEE/DCCV on 08/19/18. Patient had done very well from an afib standpoint until 05/14/21 when he noted heart racing and leg weakness as he was getting in bed. He presented to the ED for evaluation but his symptoms abated and he left before being seen. He resumed his Xarelto that same day. He was seen in the AF clinic and DCCV was arranged but he spontaneously converted and the procedure was cancelled.   On follow up today, patient reports he went into afib on 08/10/21 and was persistent until 08/13/21. He resumed his Xarelto at the onset of his symptoms. He does report that over the last several weeks he has had more frequent afib, typically lasting 2-3 hours. There are no specific triggers that he could identify.   Today, he denies symptoms of palpitations, chest pain, orthopnea, PND, lower extremity edema, dizziness, presyncope, syncope, bleeding, or neurologic sequela. The patient is tolerating medications without difficulties and is otherwise without complaint today.    Atrial Fibrillation Risk Factors:  he does have symptoms or diagnosis of sleep apnea.  He is not on CPAP therapy. he does not have a history of rheumatic fever. he does not have a history of alcohol use. The patient does have a history of early familial atrial fibrillation or other arrhythmias.  he has a BMI of Body mass index is 33.19 kg/m.Marland Kitchen Filed Weights   08/14/21 1059  Weight: 108 kg       Atrial Fibrillation Management history:  Previous  antiarrhythmic drugs: flecainide Previous cardioversions: 02/23/12, 08/19/18 Previous ablations: none CHADS2VASC score: 0 Anticoagulation history: Xarelto  Past Medical History:  Diagnosis Date   Abnormal blood test 02/24/2012   Atrial fibrillation (HCC)    Shingles    X3   Past Surgical History:  Procedure Laterality Date   CARDIOVERSION N/A 08/19/2018   Procedure: CARDIOVERSION;  Surgeon: Jake Bathe, MD;  Location: [facility] ENDOSCOPY;  Service: Cardiovascular;  Laterality: N/A;   FINGER FRACTURE SURGERY     KNEE ARTHROSCOPY Left    TEE WITHOUT CARDIOVERSION N/A 08/19/2018   Procedure: TRANSESOPHAGEAL ECHOCARDIOGRAM (TEE);  Surgeon: Jake Bathe, MD;  Location: [facility] ENDOSCOPY;  Service: Cardiovascular;  Laterality: N/A;   TONSILLECTOMY      Current Outpatient Medications  Medication Sig Dispense Refill   aspirin EC 81 MG tablet Take 81 mg by mouth daily.     atorvastatin (LIPITOR) 10 MG tablet Take 10 mg by mouth daily.  0   diltiazem (CARDIZEM CD) 300 MG 24 hr capsule Take 1 capsule (300 mg total) by mouth daily. 90 capsule 2   flecainide (TAMBOCOR) 100 MG tablet TAKE 1 TABLET(100 MG) BY MOUTH EVERY 12 HOURS 180 tablet 1   fluticasone (FLONASE) 50 MCG/ACT nasal spray Place 1 spray into both nostrils daily as needed for allergies.      Glucosamine-Chondroitin (GLUCOSAMINE CHONDR COMPLEX PO) Take 1 tablet by mouth daily.      meloxicam (MOBIC) 15 MG tablet Take 15 mg by mouth daily.     rivaroxaban (XARELTO)  20 MG TABS tablet Take 1 tablet (20 mg total) by mouth daily with supper. 90 tablet 3   No current facility-administered medications for this encounter.    Allergies  Allergen Reactions   Penicillins Other (See Comments)    Childhood allergy, almost died   Simvastatin     Other reaction(s): AFIB    Social History   Socioeconomic History   Marital status: Married    Spouse name: Not on file   Number of children: Not on file   Years of education: Not on file   Highest  education level: Not on file  Occupational History    Employer: [employer]  Tobacco Use   Smoking status: Never   Smokeless tobacco: Never  Vaping Use   Vaping Use: Never used  Substance and Sexual Activity   Alcohol use: No   Drug use: No   Sexual activity: Not on file  Other Topics Concern   Not on file  Social History Narrative   Not on file   Social Determinants of Health   Financial Resource Strain: Not on file  Food Insecurity: Not on file  Transportation Needs: Not on file  Physical Activity: Not on file  Stress: Not on file  Social Connections: Not on file  Intimate Partner Violence: Not on file    Family History  Problem Relation Age of Onset   Heart disease Mother    Diabetes Mother    Cancer - Lung Father    Cancer - Other Sister      ROS- All systems are reviewed and negative except as per the HPI above.  Physical Exam: Vitals:   08/14/21 1059  BP: 122/80  Pulse: 68  Weight: 108 kg  Height: 5\' 11"  (1.803 m)     GEN- The patient is a well appearing obese male, alert and oriented x 3 today.   HEENT-head normocephalic, atraumatic, sclera clear, conjunctiva pink, hearing intact, trachea midline. Lungs- Clear to ausculation bilaterally, normal work of breathing Heart- Regular rate and rhythm, no murmurs, rubs or gallops  GI- soft, NT, ND, + BS Extremities- no clubbing, cyanosis, or edema MS- no significant deformity or atrophy Skin- no rash or lesion Psych- euthymic mood, full affect Neuro- strength and sensation are intact   Wt Readings from Last 3 Encounters:  08/14/21 108 kg  05/16/21 109.7 kg  11/29/20 109 kg    EKG today demonstrates  SR, inc LBBB Vent. rate 68 BPM PR interval 190 ms QRS duration 122 ms QT/QTcB 450/478 ms  Echo 02/2012 demonstrated  Left ventricle: The cavity size was normal. Wall thickness   was increased in a pattern of moderate LVH. Systolic   function was normal. The estimated ejection fraction  was   in the range of 55% to 65%. Wall motion was normal; there   were no regional wall motion abnormalities. Left   ventricular diastolic function parameters were normal. - Aortic valve: Trivial regurgitation. - Mitral valve: Mild regurgitation. - Atrial septum: No defect or patent foramen ovale was   identified.  TEE 08/19/18 - Left ventricle: Systolic function was normal. The estimated    ejection fraction was in the range of 60% to 65%. Wall motion was    normal; there were no regional wall motion abnormalities. No    evidence of thrombus.  - Aortic valve: No evidence of vegetation. There was trivial    regurgitation.  - Mitral valve: No evidence of vegetation. There was mild    regurgitation.  -  Left atrium: No evidence of thrombus in the atrial cavity or    appendage. No evidence of thrombus in the atrial cavity or    appendage. No evidence of thrombus in the appendage.  - Right atrium: No evidence of thrombus in the atrial cavity or    appendage.  - Atrial septum: There was a patent foramen ovale. Doppler and    agitated saline contrast study showed a small right-to-left shunt    through a patent foramen ovale, in the baseline state.  - Tricuspid valve: No evidence of vegetation.  - Pulmonic valve: No evidence of vegetation.   Impressions:   - Successful cardioversion. No cardiac source of emboli was    indentified.   Epic records are reviewed at length today  CHA2DS2-VASc Score = 0  The patient's score is based upon: CHF History: 0 HTN History: 0 Diabetes History: 0 Stroke History: 0 Vascular Disease History: 0 Age Score: 0 Gender Score: 0       ASSESSMENT AND PLAN: 1. Persistent Atrial Fibrillation/atrial flutter The patient's CHA2DS2-VASc score is 0, indicating a 0.2% annual risk of stroke.   Patient converted back to SR but he is having more frequent episodes overall.  He would like to discuss afib ablation with EP, will refer.  Increase diltiazem to  360 mg daily Continue flecainide 100 mg BID Continue Xarelto 20 mg daily Check echocardiogram  2. Obesity Body mass index is 33.19 kg/m. Lifestyle modification was discussed and encouraged including regular physical activity and weight reduction.   Follow up with EP for ablation evaluation.    Jorja Loaicky Elzada Pytel PA-C [facility] [address] [place], [place] [address] [phone] 08/14/2021 11:10 AM

## 2021-08-14 NOTE — Patient Instructions (Signed)
Increase cardizem to 360mg once a day 

## 2021-08-27 ENCOUNTER — Telehealth: Payer: Self-pay

## 2021-08-27 NOTE — Telephone Encounter (Signed)
Left vm with Pamala Hurry at [place] to pls page our on call provider [place]) for recommendations since schedule is booked per Joycelyn Schmid. I gave pager number. I will have Joycelyn Schmid hold referral until we know more.

## 2021-08-27 NOTE — Telephone Encounter (Signed)
Please refer to provider on call

## 2021-08-27 NOTE — Telephone Encounter (Signed)
Dr Selena Batten is faxing a referral from [facility] URGENT patient failed multiple oral abx for a bad case of cellulitis. We are booked up and so we need to see if any provider can review fax and work new patient in and call her nurse Britta Mccreedy with date and time of appt [phone].

## 2021-08-28 ENCOUNTER — Encounter: Payer: Self-pay | Admitting: Infectious Diseases

## 2021-08-28 ENCOUNTER — Ambulatory Visit: Payer: 59 | Admitting: Infectious Diseases

## 2021-08-28 ENCOUNTER — Other Ambulatory Visit: Payer: Self-pay

## 2021-08-28 VITALS — BP 119/79 | HR 58 | Temp 97.7°F | Ht 71.0 in | Wt 245.0 lb

## 2021-08-28 DIAGNOSIS — L0390 Cellulitis, unspecified: Secondary | ICD-10-CM

## 2021-08-28 DIAGNOSIS — Z880 Allergy status to penicillin: Secondary | ICD-10-CM

## 2021-08-28 DIAGNOSIS — L03115 Cellulitis of right lower limb: Secondary | ICD-10-CM | POA: Diagnosis not present

## 2021-08-28 MED ORDER — CLINDAMYCIN HCL 300 MG PO CAPS: 300.0000 mg | ORAL_CAPSULE | Freq: Four times a day (QID) | ORAL | 0 refills | Status: AC

## 2021-08-28 NOTE — Progress Notes (Signed)
Patient: Cesar Wheeler  DOB: [birth year] MRN: [id number] PCP: Gweneth Dimitri, MD  Referring Provider: Dr. Gweneth Dimitri [facility])    Patient Active Problem List   Diagnosis Date Noted   History of penicillin allergy 08/29/2021   Cellulitis of right lower extremity 08/29/2021   Cellulitis 08/28/2021   PAF (paroxysmal atrial fibrillation) (HCC) 11/29/2019   Obesity (BMI 30-39.9) 03/10/2019   OSA (obstructive sleep apnea) 03/10/2019   Atypical atrial flutter (HCC)    GERD (gastroesophageal reflux disease) 07/09/2012   Persistent atrial fibrillation (HCC) 02/24/2012     Subjective:  Cesar Wheeler is a 66 y.o. male referred to ID clinic for assistance with refractory cellulitis.   PCP called to d/w Dr. Elinor Parkinson yesterday afternoon with concern over failed treatment for cellulitis.   Records reviewed from PCP and [employer] [facility]-  Mr. Leclaire was out on cruise and scraped his left elbow and left knee 3 weeks ago with progressive pruritis that led to a pustular rash on the left elbow. There was a concern for possible plan exposure that triggered the itching here. Abrasions remained unhealed during cruise. On Thursday 2/2 he developed pretty abrupt onset erythema and edema to the right lower extremity that initially was a little hard to see with sunburn from his cruise.  He received two doses of IV ceftriaxone on 2/4 & 2/5 as well as IV vancomycin on 2/5 with oral clindamycin 300 mg Q8 + topical mupirocin ointment BID to apply to his elbow and two pustules / abrasions on the right knee.   He does not smoke cigarettes. No drug or routine alcohol use.  He is not a known diabetic.  He is a long time Personnel officer and self-employed. Spends a lot of time on knees performing job tasks and has osteoarthritis in both knees. During the acute onset of skin infection he did note that the anterior knee was pretty painful but no restrictions in his range of motion of the knee itself. He feels that pain has  improved but he does have a throbbing pain with visualized blood vessels in the right calf and obvious swelling to the ankle up to the knee. He does spend a lot of time propped up on pillows keeping his leg up. He is tolerating the antibiotics without problems and taking this three times a day as instructed. No fevers, chills or systemic symptoms of illness.  He did spend a lot of time driving in [place] the cruise [place] but had his leg propped up the whole drive.   Childhood allergy to penicillin. Has not challenged this since. Tolerated cephalosporins well.    Review of Systems  Constitutional:  Negative for chills, fever and malaise/fatigue.  Gastrointestinal:  Negative for abdominal pain, diarrhea, nausea and vomiting.  Musculoskeletal:  Positive for joint pain. Negative for myalgias.  Skin:  Positive for rash.  Neurological:  Negative for dizziness and weakness.   Past Medical History:  Diagnosis Date   Abnormal blood test 02/24/2012   Atrial fibrillation (HCC)    Shingles    X3    Outpatient Medications Prior to Visit  Medication Sig Dispense Refill   atorvastatin (LIPITOR) 10 MG tablet Take 10 mg by mouth daily.  0   diltiazem (CARDIZEM CD) 360 MG 24 hr capsule Take 1 capsule (360 mg total) by mouth daily. 30 capsule 3   flecainide (TAMBOCOR) 100 MG tablet TAKE 1 TABLET(100 MG) BY MOUTH EVERY 12 HOURS 180 tablet 1  fluticasone (FLONASE) 50 MCG/ACT nasal spray Place 1 spray into both nostrils daily as needed for allergies.      meloxicam (MOBIC) 15 MG tablet Take 15 mg by mouth daily.     rivaroxaban (XARELTO) 20 MG TABS tablet Take 1 tablet (20 mg total) by mouth daily with supper. 90 tablet 3   Glucosamine-Chondroitin (GLUCOSAMINE CHONDR COMPLEX PO) Take 1 tablet by mouth daily.  (Patient not taking: Reported on 08/28/2021)     clindamycin (CLEOCIN) 300 MG capsule 1 capsule     No facility-administered medications prior to visit.     Allergies  Allergen Reactions    Penicillins Other (See Comments)    Childhood allergy, almost died   Simvastatin     Other reaction(s): AFIB    Social History   Tobacco Use   Smoking status: Never   Smokeless tobacco: Never  Vaping Use   Vaping Use: Never used  Substance Use Topics   Alcohol use: No   Drug use: No    Family History  Problem Relation Age of Onset   Heart disease Mother    Diabetes Mother    Cancer - Lung Father    Cancer - Other Sister     Objective:   Vitals:   08/28/21 1518  BP: 119/79  Pulse: (!) 58  Temp: 97.7 F (36.5 C)  TempSrc: Oral  SpO2: 98%  Weight: 245 lb (111.1 kg)  Height: 5\' 11"  (1.803 m)   Body mass index is 34.17 kg/m.  Physical Exam Vitals reviewed.  Constitutional:      Appearance: Normal appearance. He is not ill-appearing.  Cardiovascular:     Rate and Rhythm: Normal rate.  Pulmonary:     Effort: Pulmonary effort is normal.  Abdominal:     General: There is no distension.  Musculoskeletal:     Right lower leg: Edema present.     Left lower leg: No edema.     Comments: Two healed / scabbed over areas to the right infrapatellar region. Non-tender. No joint effusion noted. No TTP overlying joint. Negative homan's sign. No calf TTP. No visualized varicosities but some small spider veins noted around ankle and back of calf.  Erythema noted around anterior and medial knee. Slight warmth. He has pitting edema to the lower ankle with dry flaking skin that extends up to the mid shin.   Neurological:     Mental Status: He is alert.    Right knee -    Left elbow - dry scabs with one pustule noted    Lab Results: Lab Results  Component Value Date   WBC 6.2 05/15/2021   HGB 14.7 05/15/2021   HCT 44.9 05/15/2021   MCV 91.8 05/15/2021   PLT 191 05/15/2021    Lab Results  Component Value Date   CREATININE 1.13 05/15/2021   BUN 18 05/15/2021   NA 138 05/15/2021   K 3.7 05/15/2021   CL 105 05/15/2021   CO2 24 05/15/2021    Lab Results   Component Value Date   ALT 56 (H) 02/26/2012   AST 36 02/26/2012   ALKPHOS 58 02/26/2012   BILITOT 0.9 02/26/2012     Assessment & Plan:   Problem List Items Addressed This Visit       Unprioritized   Cellulitis - Primary   Relevant Medications   clindamycin (CLEOCIN) 300 MG capsule   History of penicillin allergy    Based on history reasonable possibility he is no longer allergic to penicillin  if he ever truly was.  He has not required a lot of antibiotic treatments in the past. Discussed it would be beneficial for him on a non-urgent basis to seek out penicillin skin testing with allergy team. This would allow him cheaper and safer antibiotic options for treatment for future infections.       Cellulitis of right lower extremity    Very nice 66 yo male referred for assistance in management of first occurrence of cellulitis to the right knee/anterior shin after sustaining abrasions on the left elbow and right patella that progressed to a more pustular rash.  There was suspicion for possible MRSA infection - certainly possible given appearance of pustules on skin pictured. Clindamycin has higher risk for cdiff infection and not my favorite option due to this, but he seems to be tolerating this well. Does usually cover community MRSA along with sensitive staphylococcus and streptococcus.  Will optimize dose a bit and increase to QID dosing (with meals then prior to bed) for another 5 days. No indications for IV antibiotics. No suspicion for septic joint/bursa. I think he is improving - explained that it is not uncommon to have some acutely worsened redness / pain over the first 24-48h of treating due to toxin release.   Very low suspicion for blood clot - swelling related to skin and soft tissue infection. We researched compression stockings and advised to use 15-54mmHg sock to the right leg; knee high should be sufficient. Cool compresses, elevation and staying off leg until swelling is  better advised. He can sleep in them for a few nights also if he finds this to be more comfortable and has no throbbing pain from them. Pulses are easily palpated on my exam today.  Lotion applications to dry skin to help with skin integrity (skin burn and cellulitis contributing).   Regarding risk factors for recurrent cellulitis - he does not smoke, does not have diabetes, does not have chronic lower extremity swelling/lymphedema. Suspect that he will continue to improve and resolve this infection with low risk for recurrence.   I will set him up with a telephone or in person visit next week after he completes therapy to ensure he continues to improve.       Total Encounter Time: 40 min including both face to face and non-face to face time spent in treatment discussion, evaluation and review of available records from cruise/pcp.   Rexene Alberts, MSN, NP-C [facility] for Infectious Disease [facility] Pager: [phone] Office: [phone]  08/29/21  11:01 AM

## 2021-08-28 NOTE — Patient Instructions (Addendum)
I think you are on the road to recovering from this infection. I can see the redness is starting to look better.   Please INCREASE your clindamycin to 4 times a day - I will send in a 5-day supply at this dose for you.   Compression stocking - 15-20 mmHg to help with the swelling - can sleep in it if it feels good.  Take the socks off 1-2 times a day to check your skin.  Cool compresses and elevation of the leg at or above the heart can also help with the pain / swelling.  Keep skin well moisturized with scent free lotion (Eucerin, Vaseline)   Schedule at the front a telephone visit with me next week so we can ensure your leg is better - Tuesday or Wednesday I would say.   Feel free to update me if you have anything else come up or any other concerns.

## 2021-08-29 DIAGNOSIS — Z880 Allergy status to penicillin: Secondary | ICD-10-CM | POA: Insufficient documentation

## 2021-08-29 DIAGNOSIS — L03115 Cellulitis of right lower limb: Secondary | ICD-10-CM | POA: Insufficient documentation

## 2021-08-29 NOTE — Assessment & Plan Note (Signed)
Based on history reasonable possibility he is no longer allergic to penicillin if he ever truly was.  He has not required a lot of antibiotic treatments in the past. Discussed it would be beneficial for him on a non-urgent basis to seek out penicillin skin testing with allergy team. This would allow him cheaper and safer antibiotic options for treatment for future infections.

## 2021-08-29 NOTE — Assessment & Plan Note (Signed)
Very nice 65 yo male referred for assistance in management of first occurrence of cellulitis to the right knee/anterior shin after sustaining abrasions on the left elbow and right patella that progressed to a more pustular rash.  There was suspicion for possible MRSA infection - certainly possible given appearance of pustules on skin pictured. Clindamycin has higher risk for cdiff infection and not my favorite option due to this, but he seems to be tolerating this well. Does usually cover community MRSA along with sensitive staphylococcus and streptococcus.  Will optimize dose a bit and increase to QID dosing (with meals then prior to bed) for another 5 days. No indications for IV antibiotics. No suspicion for septic joint/bursa. I think he is improving - explained that it is not uncommon to have some acutely worsened redness / pain over the first 24-48h of treating due to toxin release.   Very low suspicion for blood clot - swelling related to skin and soft tissue infection. We researched compression stockings and advised to use 15-39mmHg sock to the right leg; knee high should be sufficient. Cool compresses, elevation and staying off leg until swelling is better advised. He can sleep in them for a few nights also if he finds this to be more comfortable and has no throbbing pain from them. Pulses are easily palpated on my exam today.  Lotion applications to dry skin to help with skin integrity (skin burn and cellulitis contributing).   Regarding risk factors for recurrent cellulitis - he does not smoke, does not have diabetes, does not have chronic lower extremity swelling/lymphedema. Suspect that he will continue to improve and resolve this infection with low risk for recurrence.   I will set him up with a telephone or in person visit next week after he completes therapy to ensure he continues to improve.

## 2021-08-31 ENCOUNTER — Telehealth: Payer: Self-pay | Admitting: Pharmacist

## 2021-08-31 NOTE — Telephone Encounter (Signed)
Patient called complaining of throat burning sensation for 1 hour after taking clindamycin the last two days. States this did not occur when taking TID but started experiencing the next day after switching QID dosing. Denies any tongue/lip swelling, rashes, diarrhea, or acid reflux symptoms. States he takes the clindamycin with food and water and that taking it with ice cream seemed to soothe his throat.  Upon questioning, patient also reclining right after taking clindamycin which could cause reflux. Encouraged him to stand up or sit straight up for an hour or two after taking the clindamycin and to continue soothing his throat with water and ice cream. He will follow-up with Judeth Cornfield on Tuesday at completion of treatment.  Margarite Gouge, PharmD, CPP Clinical Pharmacist Practitioner Infectious Diseases Clinical Pharmacist [facility] for Infectious Disease

## 2021-09-04 ENCOUNTER — Encounter: Payer: Self-pay | Admitting: Infectious Diseases

## 2021-09-04 ENCOUNTER — Other Ambulatory Visit: Payer: Self-pay

## 2021-09-04 ENCOUNTER — Ambulatory Visit (INDEPENDENT_AMBULATORY_CARE_PROVIDER_SITE_OTHER): Payer: 59 | Admitting: Infectious Diseases

## 2021-09-04 DIAGNOSIS — L03115 Cellulitis of right lower limb: Secondary | ICD-10-CM | POA: Diagnosis not present

## 2021-09-04 NOTE — Progress Notes (Signed)
Patient: Cesar Wheeler  DOB: 08/25/1955 MRN: [id number] PCP: Cari Caraway, MD  Referring Provider: Dr. Cari Caraway [facility])   VIRTUAL CARE ENCOUNTER  I connected with Amedeo Plenty on 09/04/21 at 10:15 AM EST by telephone and verified that I am speaking with the correct person using two identifiers.   I discussed the limitations, risks, security and privacy concerns of performing an evaluation and management service by telephone and the availability of in person appointments. I also discussed with the patient that there may be a patient responsible charge related to this service. The patient expressed understanding and agreed to proceed.  Patient Location: [place] residence / home   Other Participants:   Provider Location: [facility] Office    Patient Active Problem List   Diagnosis Date Noted   History of penicillin allergy 08/29/2021   Cellulitis of right lower extremity 08/29/2021   Cellulitis 08/28/2021   PAF (paroxysmal atrial fibrillation) ([place]) 11/29/2019   Obesity (BMI 30-39.9) 03/10/2019   OSA (obstructive sleep apnea) 03/10/2019   Atypical atrial flutter (HCC)    GERD (gastroesophageal reflux disease) 07/09/2012   Persistent atrial fibrillation ([place]) 02/24/2012     Subjective:  Cesar Wheeler is a 66 y.o. male referred to ID clinic for assistance with refractory cellulitis.   He is nearly 100% improved from our in person visit last week. Redness was the most lingering symptom and is just barely seen today. Still a little sensitive with the skin but much much better. Has been wearing his compression stockings and was a very good result. Using some sensitive skin lotion Swelling essentially gone.   Had some reflux symptoms to the increased dose of the clindamycin - this improved with sitting upright for 45 min after dosing and not reclining back in the chair   ROS  Past Medical History:  Diagnosis Date   Abnormal blood test 02/24/2012   Atrial fibrillation (HCC)     Shingles    X3    Outpatient Medications Prior to Visit  Medication Sig Dispense Refill   atorvastatin (LIPITOR) 10 MG tablet Take 10 mg by mouth daily.  0   diltiazem (CARDIZEM CD) 360 MG 24 hr capsule Take 1 capsule (360 mg total) by mouth daily. 30 capsule 3   flecainide (TAMBOCOR) 100 MG tablet TAKE 1 TABLET(100 MG) BY MOUTH EVERY 12 HOURS 180 tablet 1   fluticasone (FLONASE) 50 MCG/ACT nasal spray Place 1 spray into both nostrils daily as needed for allergies.      Glucosamine-Chondroitin (GLUCOSAMINE CHONDR COMPLEX PO) Take 1 tablet by mouth daily.  (Patient not taking: Reported on 08/28/2021)     meloxicam (MOBIC) 15 MG tablet Take 15 mg by mouth daily.     rivaroxaban (XARELTO) 20 MG TABS tablet Take 1 tablet (20 mg total) by mouth daily with supper. 90 tablet 3   No facility-administered medications prior to visit.     Allergies  Allergen Reactions   Penicillins Other (See Comments)    Childhood allergy, almost died   Simvastatin     Other reaction(s): AFIB    Social History   Tobacco Use   Smoking status: Never   Smokeless tobacco: Never  Vaping Use   Vaping Use: Never used  Substance Use Topics   Alcohol use: No   Drug use: No    Family History  Problem Relation Age of Onset   Heart disease Mother    Diabetes Mother    Cancer - Lung Father  Cancer - Other Sister     Objective:   There were no vitals filed for this visit.  There is no height or weight on file to calculate BMI.  Physical Exam   Lab Results: Lab Results  Component Value Date   WBC 6.2 05/15/2021   HGB 14.7 05/15/2021   HCT 44.9 05/15/2021   MCV 91.8 05/15/2021   PLT 191 05/15/2021    Lab Results  Component Value Date   CREATININE 1.13 05/15/2021   BUN 18 05/15/2021   NA 138 05/15/2021   K 3.7 05/15/2021   CL 105 05/15/2021   CO2 24 05/15/2021    Lab Results  Component Value Date   ALT 56 (H) 02/26/2012   AST 36 02/26/2012   ALKPHOS 58 02/26/2012   BILITOT 0.9  02/26/2012     Assessment & Plan:   Problem List Items Addressed This Visit       Unprioritized   Cellulitis of right lower extremity    He is nearly 100% better. All swelling is gone. Skin is a bit itchy at the knee where previous abrasions were.  Encouraged him to continue the compression stocking as he returns to work to keep swelling minimal.  Probably OK to stop the mupiricin ointment also with all wounds closed up.  Counseled what to look for if any relapsed symptoms. He can finish up his last dose of Clindamycin as scheduled.  RTC PRN - we are happy to see him back in the future if needed.   Would really consider the allergy testing referral to investigate pcn allergy for future antibiotic use. Defer to PCP team.        Follow Up Instructions: PRN follow up    I discussed the assessment and treatment plan with the patient. The patient was provided an opportunity to ask questions and all were answered. The patient agreed with the plan and demonstrated an understanding of the instructions.   The patient was advised to call back or seek an in-person evaluation if the symptoms worsen or if the condition fails to improve as anticipated.  I provided 6 minutes of non-face-to-face time during this encounter.   Janene Madeira, MSN, NP-C [facility] for Infectious Disease [email or web address] Pager: [phone] Office: [phone] [facility] Main Line: [place], MSN, NP-C [place] for Infectious [place] Pager: [phone] Office: [phone]  09/04/21  10:40 AM

## 2021-09-04 NOTE — Assessment & Plan Note (Signed)
He is nearly 100% better. All swelling is gone. Skin is a bit itchy at the knee where previous abrasions were.  Encouraged him to continue the compression stocking as he returns to work to keep swelling minimal.  Probably OK to stop the mupiricin ointment also with all wounds closed up.  Counseled what to look for if any relapsed symptoms. He can finish up his last dose of Clindamycin as scheduled.  RTC PRN - we are happy to see him back in the future if needed.   Would really consider the allergy testing referral to investigate pcn allergy for future antibiotic use. Defer to PCP team.

## 2021-09-05 ENCOUNTER — Other Ambulatory Visit: Payer: Self-pay

## 2021-09-05 ENCOUNTER — Ambulatory Visit (HOSPITAL_COMMUNITY)
Admission: RE | Admit: 2021-09-05 | Discharge: 2021-09-05 | Disposition: A | Discharge status: Home / Self Care | Payer: 59 | Source: Ambulatory Visit | Attending: Family Medicine | Admitting: Family Medicine

## 2021-09-05 DIAGNOSIS — I358 Other nonrheumatic aortic valve disorders: Secondary | ICD-10-CM | POA: Diagnosis not present

## 2021-09-05 DIAGNOSIS — I480 Paroxysmal atrial fibrillation: Secondary | ICD-10-CM

## 2021-09-05 DIAGNOSIS — I340 Nonrheumatic mitral (valve) insufficiency: Secondary | ICD-10-CM | POA: Diagnosis not present

## 2021-09-05 LAB — ECHOCARDIOGRAM COMPLETE
Area-P 1/2: 3.31 cm2
S' Lateral: 3.5 cm

## 2021-09-06 ENCOUNTER — Encounter: Payer: Self-pay | Admitting: Cardiology

## 2021-09-06 NOTE — Telephone Encounter (Signed)
Called patient and gave him reassurance that there was no immediate concern.  Verbalized understanding.

## 2021-09-30 NOTE — Progress Notes (Deleted)
Saw Cesar Wheeler 1/24 ?Several years of AF, was on flecainide ?Now persistent. Had DCCV 08/19/2018 ?Symptomatic ?Has been more frequent recently ? ?09/05/2021 Echo - EF 60. RV normal. Mild MR.  ? ?08/10/2018 ECG - AFL ?05/15/2021 ECG - AF w/ RVR ? ?PVI + posterior wall + CTI ?Apixaban ? ?

## 2021-10-02 ENCOUNTER — Ambulatory Visit: Payer: 59 | Admitting: Cardiology

## 2021-10-02 ENCOUNTER — Other Ambulatory Visit: Payer: Self-pay

## 2021-10-02 ENCOUNTER — Encounter: Payer: Self-pay | Admitting: Cardiology

## 2021-10-02 VITALS — BP 126/74 | HR 76 | Ht 71.0 in | Wt 244.4 lb

## 2021-10-02 DIAGNOSIS — I483 Typical atrial flutter: Secondary | ICD-10-CM | POA: Diagnosis not present

## 2021-10-02 DIAGNOSIS — I4819 Other persistent atrial fibrillation: Secondary | ICD-10-CM

## 2021-10-02 NOTE — Patient Instructions (Signed)
Medication Instructions:  Your physician recommends that you continue on your current medications as directed. Please refer to the Current Medication list given to you today.  *If you need a refill on your cardiac medications before your next appointment, please call your pharmacy*   Lab Work: None ordered today  If you have labs (blood work) drawn today and your tests are completely normal, you will receive your results only by: . MyChart Message (if you have MyChart) OR . A paper copy in the mail If you have any lab test that is abnormal or we need to change your treatment, we will call you to review the results.   Testing/Procedures: Your physician has recommended that you have an ablation. Catheter ablation is a medical procedure used to treat some cardiac arrhythmias (irregular heartbeats). During catheter ablation, a long, thin, flexible tube is put into a blood vessel in your groin (upper thigh), or neck. This tube is called an ablation catheter. It is then guided to your heart through the blood vessel. Radio frequency waves destroy small areas of heart tissue where abnormal heartbeats may cause an arrhythmia to start. Please see the instruction sheet given to you today.   Follow-Up: At [facility], you and your health needs are our priority.  As part of our continuing mission to provide you with exceptional heart care, we have created designated Provider Care Teams.  These Care Teams include your primary Cardiologist (physician) and Advanced Practice Providers (APPs -  Physician Assistants and Nurse Practitioners) who all work together to provide you with the care you need, when you need it.  We recommend signing up for the patient portal called "MyChart".  Sign up information is provided on this After Visit Summary.  MyChart is used to connect with patients for Virtual Visits (Telemedicine).  Patients are able to view lab/test results, encounter notes, upcoming appointments, etc.   Non-urgent messages can be sent to your provider as well.   To learn more about what you can do with MyChart, go to [email or web address].    Your next appointment:   To be scheduled  

## 2021-10-02 NOTE — Progress Notes (Signed)
?Electrophysiology Office Note:   ? ?Date:  10/02/2021  ? ?ID:  Cesar Wheeler, DOB 1956/06/26, MRN [id number] ? ?PCP:  Cari Caraway, MD  ?[facility] Cardiologist:  None  ?[facility] Electrophysiologist:  Vickie Epley, MD  ? ?Referring MD: Oliver Barre, PA  ? ?Chief Complaint: Consult for Afib ablation ? ?History of Present Illness:   ? ?Cesar Wheeler is a 66 y.o. male who presents for an evaluation of possible ablation at the request of Cesar Wheeler, [place]. Their medical history includes atrial fibrillation, and shingles. ? ?He saw Cesar Peals, PA on 08/14/2021. He reported reverting to Afib 08/10/21, persistent until 08/13/21. He had resumed Xarelto at the onset of his symptoms. Also he noted more frequent episodes of Afib for several weeks prior with a typical duration of 2-3 hours. He was referred to EP to discuss atrial fibrillation ablation. His diltiazem was increased to 360 mg daily. ? ?He was diagnosed with Afib several years prior, done well on flecainide. He underwent DCCV on 08/19/2018. However, he had an episode of racing heart rates and leg weakness when getting out of bed on 05/14/2021. He resumed Xarelto that same day.  ? ?Overall, he appears well. He states he has been dealing with atrial fibrillation for 10 years.  ? ?He remains compliant with flecainide and diltiazem. Since diltiazem was increased in 07/2021, he denies any recurrent episodes of arrhythmia. ? ?He denies any chest pain, shortness of breath, or peripheral edema. No lightheadedness, headaches, syncope, orthopnea, or PND. ? ?Of note, he notes he will be starting Medicare in a few months. ? ?He recently returned from a cruise to the [place]. ? ? ?  ?Past Medical History:  ?Diagnosis Date  ? Abnormal blood test 02/24/2012  ? Atrial fibrillation (Cesar Wheeler)   ? Shingles   ? X3  ? ? ?Past Surgical History:  ?Procedure Laterality Date  ? CARDIOVERSION N/A 08/19/2018  ? Procedure: CARDIOVERSION;  Surgeon: Jerline Pain, MD;  Location: [facility]  ENDOSCOPY;  Service: Cardiovascular;  Laterality: N/A;  ? FINGER FRACTURE SURGERY    ? KNEE ARTHROSCOPY Left   ? TEE WITHOUT CARDIOVERSION N/A 08/19/2018  ? Procedure: TRANSESOPHAGEAL ECHOCARDIOGRAM (TEE);  Surgeon: Jerline Pain, MD;  Location: [facility] ENDOSCOPY;  Service: Cardiovascular;  Laterality: N/A;  ? TONSILLECTOMY    ? ? ?Current Medications: ?Current Meds  ?Medication Sig  ? atorvastatin (LIPITOR) 10 MG tablet Take 10 mg by mouth daily.  ? diltiazem (CARDIZEM CD) 360 MG 24 hr capsule Take 1 capsule (360 mg total) by mouth daily.  ? flecainide (TAMBOCOR) 100 MG tablet TAKE 1 TABLET(100 MG) BY MOUTH EVERY 12 HOURS  ? fluticasone (FLONASE) 50 MCG/ACT nasal spray Place 1 spray into both nostrils daily as needed for allergies.   ? Glucosamine-Chondroitin (GLUCOSAMINE CHONDR COMPLEX PO) Take 1 tablet by mouth daily.  ? meloxicam (MOBIC) 15 MG tablet Take 15 mg by mouth daily.  ? mupirocin ointment (BACTROBAN) 2 % 1 application  ? rivaroxaban (XARELTO) 20 MG TABS tablet Take 1 tablet (20 mg total) by mouth daily with supper.  ?  ? ?Allergies:   Penicillins and Simvastatin  ? ?Social History  ? ?Socioeconomic History  ? Marital status: Married  ?  Spouse name: Not on file  ? Number of children: Not on file  ? Years of education: Not on file  ? Highest education level: Not on file  ?Occupational History  ?  Employer: [employer]  ?Tobacco Use  ?  Smoking status: Never  ? Smokeless tobacco: Never  ?Vaping Use  ? Vaping Use: Never used  ?Substance and Sexual Activity  ? Alcohol use: No  ? Drug use: No  ? Sexual activity: Not on file  ?Other Topics Concern  ? Not on file  ?Social History Narrative  ? Not on file  ? ?Social Determinants of Health  ? ?Financial Resource Strain: Not on file  ?Food Insecurity: Not on file  ?Transportation Needs: Not on file  ?Physical Activity: Not on file  ?Stress: Not on file  ?Social Connections: Not on file  ?  ? ?Family History: ?The patient's family history includes Cancer -  Lung in his father; Cancer - Other in his sister; Diabetes in his mother; Heart disease in his mother. ? ?ROS:   ?Please see the history of present illness.    ?All other systems reviewed and are negative. ? ?EKGs/Labs/Other Studies Reviewed:   ? ?The following studies were reviewed today: ? ?Echo 09/05/2021: ? 1. Left ventricular ejection fraction, by estimation, is 60 to 65%. The  ?left ventricle has normal function. The left ventricle has no regional  ?wall motion abnormalities. Left ventricular diastolic parameters were  ?normal.  ? 2. Right ventricular systolic function is normal. The right ventricular  ?size is normal. There is normal pulmonary artery systolic pressure.  ? 3. The mitral valve is abnormal. Mild mitral valve regurgitation. No  ?evidence of mitral stenosis.  ? 4. The aortic valve is tricuspid. There is mild calcification of the  ?aortic valve. Aortic valve regurgitation is trivial. Aortic valve  ?sclerosis is present, with no evidence of aortic valve stenosis.  ? 5. Aortic dilatation noted. 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  ? 6. The inferior vena cava is normal in size with greater than 50%  ?respiratory variability, suggesting right atrial pressure of 3 mmHg. ? ? ?EKG:   EKG is personally reviewed.  ?10/02/2021: EKG was not ordered. ? ? ?Recent Labs: ?05/15/2021: B Natriuretic Peptide 85.5; BUN 18; Creatinine, Ser 1.13; Hemoglobin 14.7; Magnesium 2.0; Platelets 191; Potassium 3.7; Sodium 138  ? ?Recent Lipid Panel ?No results found for: CHOL, TRIG, HDL, CHOLHDL, VLDL, LDLCALC, LDLDIRECT ? ?Physical Exam:   ? ?VS:  BP 126/74   Pulse 76   Ht 5\' 11"  (1.803 m)   Wt 244 lb 6.4 oz (110.9 kg)   SpO2 97%   BMI 34.09 kg/m?    ? ?Wt Readings from Last 3 Encounters:  ?10/02/21 244 lb 6.4 oz (110.9 kg)  ?08/28/21 245 lb (111.1 kg)  ?08/14/21 238 lb (108 kg)  ?  ? ?GEN: Well nourished, well developed in no acute distress ?HEENT: Normal ?NECK: No JVD; No carotid  bruits ?LYMPHATICS: No lymphadenopathy ?CARDIAC: RRR, no murmurs, rubs, gallops ?RESPIRATORY:  Clear to auscultation without rales, wheezing or rhonchi  ?ABDOMEN: Soft, non-tender, non-distended ?MUSCULOSKELETAL:  No edema; No deformity  ?SKIN: Warm and dry ?NEUROLOGIC:  Alert and oriented x 3 ?PSYCHIATRIC:  Normal affect  ? ? ?  ? ?ASSESSMENT:   ? ?1. Persistent atrial fibrillation ([place])   ?2. Typical atrial flutter ([place])   ? ?PLAN:   ? ?In order of problems listed above: ? ?#Persistent AF ?Symptomatic despite treatment with flecainide, diltiazem. On xarelto for stroke ppx. ?I discussed the procedure in detail with the patient including the risks, recovery and likelihood of success. He would like to proceed with scheduling. ? ?Risk, benefits, and alternatives to EP study and radiofrequency  ablation for afib were also discussed in detail today. These risks include but are not limited to stroke, bleeding, vascular damage, tamponade, perforation, damage to the esophagus, lungs, and other structures, pulmonary vein stenosis, worsening renal function, and death. The patient understands these risk and wishes to proceed.  We will therefore proceed with catheter ablation at the next available time.  Carto, ICE, anesthesia are requested for the procedure.  Will also obtain CT PV protocol prior to the procedure to exclude LAA thrombus and further evaluate atrial anatomy. ? ?Ablation strategy will be PVI + posterior wall + CTI ablation. ? ?Total time spent with patient today 65 minutes. This includes reviewing records, evaluating the patient and coordinating care. ? ?Medication Adjustments/Labs and Tests Ordered: ?Current medicines are reviewed at length with the patient today.  Concerns regarding medicines are outlined above.  ?No orders of the defined types were placed in this encounter. ? ?No orders of the defined types were placed in this encounter. ? ? ?I,Mathew Stumpf,acting as a scribe for Vickie Epley, MD.,have  documented all relevant documentation on the behalf of Vickie Epley, MD,as directed by  Vickie Epley, MD while in the presence of Vickie Epley, MD. ? ?I, Vickie Epley, MD, have reviewed all documenta

## 2021-10-05 ENCOUNTER — Other Ambulatory Visit: Payer: Self-pay

## 2021-10-05 DIAGNOSIS — I4891 Unspecified atrial fibrillation: Secondary | ICD-10-CM

## 2021-10-05 MED ORDER — METOPROLOL TARTRATE 50 MG PO TABS: ORAL_TABLET | ORAL | 0 refills | Status: DC

## 2021-11-29 DIAGNOSIS — T07XXXA Unspecified multiple injuries, initial encounter: Secondary | ICD-10-CM | POA: Diagnosis not present

## 2021-11-29 DIAGNOSIS — L989 Disorder of the skin and subcutaneous tissue, unspecified: Secondary | ICD-10-CM | POA: Diagnosis not present

## 2021-11-29 DIAGNOSIS — W57XXXA Bitten or stung by nonvenomous insect and other nonvenomous arthropods, initial encounter: Secondary | ICD-10-CM | POA: Diagnosis not present

## 2021-12-05 ENCOUNTER — Other Ambulatory Visit (HOSPITAL_COMMUNITY): Payer: Self-pay | Admitting: Physician Assistant

## 2021-12-12 ENCOUNTER — Telehealth (HOSPITAL_COMMUNITY): Payer: Self-pay | Admitting: Emergency Medicine

## 2021-12-12 ENCOUNTER — Encounter (HOSPITAL_COMMUNITY): Payer: Self-pay

## 2021-12-12 ENCOUNTER — Other Ambulatory Visit (HOSPITAL_COMMUNITY): Payer: Self-pay | Admitting: *Deleted

## 2021-12-12 MED ORDER — DILTIAZEM HCL ER COATED BEADS 360 MG PO CP24: ORAL_CAPSULE | ORAL | 3 refills | Status: DC

## 2021-12-12 NOTE — Telephone Encounter (Signed)
Reaching out to patient to offer assistance regarding upcoming cardiac imaging study; pt verbalizes understanding of appt date/time, parking situation and where to check in, pre-test NPO status and medications ordered, and verified current allergies; name and call back number provided for further questions should they arise Anadia Helmes RN Navigator Cardiac Imaging [facility] [phone] office [phone] cell  Denies iv issues 50mg metoprolol tartrate Arrival 100  

## 2021-12-13 ENCOUNTER — Other Ambulatory Visit: Payer: Medicare Other

## 2021-12-13 ENCOUNTER — Ambulatory Visit (HOSPITAL_COMMUNITY)
Admission: RE | Admit: 2021-12-13 | Discharge: 2021-12-13 | Disposition: A | Discharge status: Home / Self Care | Payer: Medicare Other | Source: Ambulatory Visit | Attending: Cardiology | Admitting: Cardiology

## 2021-12-13 DIAGNOSIS — I4891 Unspecified atrial fibrillation: Secondary | ICD-10-CM | POA: Insufficient documentation

## 2021-12-13 LAB — BASIC METABOLIC PANEL
BUN/Creatinine Ratio: 15 (ref 10–24)
BUN: 16 mg/dL (ref 8–27)
CO2: 29 mmol/L (ref 20–29)
Calcium: 9.5 mg/dL (ref 8.6–10.2)
Chloride: 103 mmol/L (ref 96–106)
Creatinine, Ser: 1.07 mg/dL (ref 0.76–1.27)
Glucose: 92 mg/dL (ref 70–99)
Potassium: 4.8 mmol/L (ref 3.5–5.2)
Sodium: 138 mmol/L (ref 134–144)
eGFR: 77 mL/min/{1.73_m2} (ref >59)

## 2021-12-13 LAB — CBC WITH DIFFERENTIAL/PLATELET
Basophils Absolute: 0 10*3/uL (ref 0.0–0.2)
Basos: 1 %
EOS (ABSOLUTE): 0.1 10*3/uL (ref 0.0–0.4)
Eos: 2 %
Hematocrit: 45.5 % (ref 37.5–51.0)
Hemoglobin: 15.3 g/dL (ref 13.0–17.7)
Lymphocytes Absolute: 1.8 10*3/uL (ref 0.7–3.1)
Lymphs: 29 %
MCH: 30.3 pg (ref 26.6–33.0)
MCHC: 33.6 g/dL (ref 31.5–35.7)
MCV: 90 fL (ref 79–97)
Monocytes Absolute: 0.5 10*3/uL (ref 0.1–0.9)
Monocytes: 8 %
Neutrophils Absolute: 3.8 10*3/uL (ref 1.4–7.0)
Neutrophils: 60 %
Platelets: 194 10*3/uL (ref 150–450)
RBC: 5.05 x10E6/uL (ref 4.14–5.80)
RDW: 13.2 % (ref 11.6–15.4)
WBC: 6.2 10*3/uL (ref 3.4–10.8)

## 2021-12-13 MED ORDER — IOHEXOL 350 MG/ML SOLN
95.0000 mL | Freq: Once | INTRAVENOUS | Status: AC | PRN
  Administered 2021-12-13: 95 mL via INTRAVENOUS

## 2021-12-16 ENCOUNTER — Telehealth: Payer: Self-pay | Admitting: Physician Assistant

## 2021-12-16 MED ORDER — PREDNISONE 10 MG PO TABS: 5.0000 mg | ORAL_TABLET | Freq: Every day | ORAL | 0 refills | Status: DC

## 2021-12-16 NOTE — Telephone Encounter (Signed)
Paged by answering service.  Patient had cardiac CT on Thursday.  He currently at [facility] for vacation.  Since yesterday he has noted redness at his private area.  Mild itchiness.  Denies unusual activity.  No new lotion, laundry detergent or water activity.  Due to weather he is mostly sitting inside room.  He has tried Benadryl x3 and hydrocortisone cream with improvement.  Unusual if any reaction to dye as only has rash at his private area,  Will give prednisone 10 mg daily for 3 days.  He will reach out to his PCP once he comes back if no improvement.  Highly doubt this is due to dye allergy.

## 2021-12-18 DIAGNOSIS — R21 Rash and other nonspecific skin eruption: Secondary | ICD-10-CM | POA: Diagnosis not present

## 2021-12-19 NOTE — Pre-Procedure Instructions (Signed)
Instructed patient on the following items: Arrival time 1100 Nothing to eat or drink after midnight No meds AM of procedure Responsible person to drive you home and stay with you for 24 hrs  Have you missed any doses of anti-coagulant Xarelto- hasn't missed any doses    

## 2021-12-20 ENCOUNTER — Ambulatory Visit (HOSPITAL_COMMUNITY)
Admission: RE | Admit: 2021-12-20 | Discharge: 2021-12-21 | Disposition: A | Discharge status: Home / Self Care | Payer: Medicare Other | Attending: Cardiology | Admitting: Cardiology

## 2021-12-20 ENCOUNTER — Ambulatory Visit (HOSPITAL_COMMUNITY): Payer: Medicare Other | Admitting: Anesthesiology

## 2021-12-20 ENCOUNTER — Encounter (HOSPITAL_COMMUNITY)
Admission: RE | Disposition: A | Discharge status: Home / Self Care | Payer: Self-pay | Source: Home / Self Care | Attending: Cardiology

## 2021-12-20 ENCOUNTER — Ambulatory Visit (HOSPITAL_BASED_OUTPATIENT_CLINIC_OR_DEPARTMENT_OTHER): Payer: Medicare Other | Admitting: Anesthesiology

## 2021-12-20 ENCOUNTER — Other Ambulatory Visit: Payer: Self-pay

## 2021-12-20 ENCOUNTER — Encounter (HOSPITAL_COMMUNITY): Payer: Self-pay | Admitting: Cardiology

## 2021-12-20 DIAGNOSIS — I4891 Unspecified atrial fibrillation: Secondary | ICD-10-CM

## 2021-12-20 DIAGNOSIS — I4819 Other persistent atrial fibrillation: Secondary | ICD-10-CM | POA: Insufficient documentation

## 2021-12-20 DIAGNOSIS — I10 Essential (primary) hypertension: Secondary | ICD-10-CM | POA: Insufficient documentation

## 2021-12-20 DIAGNOSIS — K219 Gastro-esophageal reflux disease without esophagitis: Secondary | ICD-10-CM | POA: Insufficient documentation

## 2021-12-20 DIAGNOSIS — Z8619 Personal history of other infectious and parasitic diseases: Secondary | ICD-10-CM | POA: Diagnosis not present

## 2021-12-20 DIAGNOSIS — I483 Typical atrial flutter: Secondary | ICD-10-CM | POA: Diagnosis not present

## 2021-12-20 DIAGNOSIS — Z7901 Long term (current) use of anticoagulants: Secondary | ICD-10-CM | POA: Diagnosis not present

## 2021-12-20 DIAGNOSIS — G4730 Sleep apnea, unspecified: Secondary | ICD-10-CM | POA: Diagnosis not present

## 2021-12-20 DIAGNOSIS — Z79899 Other long term (current) drug therapy: Secondary | ICD-10-CM | POA: Insufficient documentation

## 2021-12-20 HISTORY — PX: ATRIAL FIBRILLATION ABLATION: EP1191

## 2021-12-20 LAB — POCT ACTIVATED CLOTTING TIME
Activated Clotting Time: 305 seconds
Activated Clotting Time: 305 seconds

## 2021-12-20 SURGERY — ATRIAL FIBRILLATION ABLATION
Anesthesia: General

## 2021-12-20 MED ORDER — DILTIAZEM HCL ER COATED BEADS 180 MG PO CP24
180.0000 mg | ORAL_CAPSULE | Freq: Every day | ORAL | Status: DC
  Administered 2021-12-20 – 2021-12-21 (×2): 180 mg via ORAL
  Filled 2021-12-20 (×2): qty 1

## 2021-12-20 MED ORDER — HEPARIN SODIUM (PORCINE) 1000 UNIT/ML IJ SOLN
INTRAMUSCULAR | Status: AC
  Filled 2021-12-20: qty 20

## 2021-12-20 MED ORDER — ACETAMINOPHEN 325 MG PO TABS: 650.0000 mg | ORAL_TABLET | ORAL | Status: DC | PRN

## 2021-12-20 MED ORDER — ONDANSETRON HCL 4 MG/2ML IJ SOLN: 4.0000 mg | Freq: Four times a day (QID) | INTRAMUSCULAR | Status: DC | PRN

## 2021-12-20 MED ORDER — PROPOFOL 10 MG/ML IV BOLUS
INTRAVENOUS | Status: DC | PRN
  Administered 2021-12-20: 150 mg via INTRAVENOUS

## 2021-12-20 MED ORDER — COLCHICINE 0.6 MG PO TABS
0.6000 mg | ORAL_TABLET | Freq: Two times a day (BID) | ORAL | Status: DC
  Administered 2021-12-20 – 2021-12-21 (×2): 0.6 mg via ORAL
  Filled 2021-12-20 (×2): qty 1

## 2021-12-20 MED ORDER — HEPARIN SODIUM (PORCINE) 1000 UNIT/ML IJ SOLN
INTRAMUSCULAR | Status: DC | PRN
  Administered 2021-12-20: 1000 [IU] via INTRAVENOUS

## 2021-12-20 MED ORDER — ACETAMINOPHEN 500 MG PO TABS
1000.0000 mg | ORAL_TABLET | Freq: Once | ORAL | Status: AC
  Administered 2021-12-20: 1000 mg via ORAL
  Filled 2021-12-20: qty 2

## 2021-12-20 MED ORDER — PROTAMINE SULFATE 10 MG/ML IV SOLN
INTRAVENOUS | Status: DC | PRN
  Administered 2021-12-20: 35 mg via INTRAVENOUS
  Administered 2021-12-20: 900 mg via INTRAVENOUS

## 2021-12-20 MED ORDER — HEPARIN (PORCINE) IN NACL 1000-0.9 UT/500ML-% IV SOLN
INTRAVENOUS | Status: AC
  Filled 2021-12-20: qty 500

## 2021-12-20 MED ORDER — PHENYLEPHRINE HCL-NACL 20-0.9 MG/250ML-% IV SOLN
INTRAVENOUS | Status: DC | PRN
  Administered 2021-12-20: 20 ug/min via INTRAVENOUS

## 2021-12-20 MED ORDER — LIDOCAINE 2% (20 MG/ML) 5 ML SYRINGE
INTRAMUSCULAR | Status: DC | PRN
  Administered 2021-12-20: 60 mg via INTRAVENOUS

## 2021-12-20 MED ORDER — FENTANYL CITRATE (PF) 100 MCG/2ML IJ SOLN
INTRAMUSCULAR | Status: AC
  Filled 2021-12-20: qty 2

## 2021-12-20 MED ORDER — HEPARIN SODIUM (PORCINE) 1000 UNIT/ML IJ SOLN
INTRAMUSCULAR | Status: AC
  Filled 2021-12-20: qty 10

## 2021-12-20 MED ORDER — SODIUM CHLORIDE 0.9% FLUSH: 3.0000 mL | INTRAVENOUS | Status: DC | PRN

## 2021-12-20 MED ORDER — HEPARIN SODIUM (PORCINE) 1000 UNIT/ML IJ SOLN
INTRAMUSCULAR | Status: DC | PRN
Start: 2021-12-20 — End: 2021-12-20
  Administered 2021-12-20: 16000 [IU] via INTRAVENOUS
  Administered 2021-12-20 (×2): 4000 [IU] via INTRAVENOUS

## 2021-12-20 MED ORDER — SODIUM CHLORIDE 0.9 % IV SOLN: 250.0000 mL | INTRAVENOUS | Status: DC | PRN

## 2021-12-20 MED ORDER — MIDAZOLAM HCL 2 MG/2ML IJ SOLN
INTRAMUSCULAR | Status: DC | PRN
  Administered 2021-12-20: 1 mg via INTRAVENOUS

## 2021-12-20 MED ORDER — HEPARIN (PORCINE) IN NACL 1000-0.9 UT/500ML-% IV SOLN
INTRAVENOUS | Status: DC | PRN
  Administered 2021-12-20 (×4): 500 mL

## 2021-12-20 MED ORDER — MIDAZOLAM HCL 2 MG/2ML IJ SOLN
INTRAMUSCULAR | Status: AC
  Filled 2021-12-20: qty 2

## 2021-12-20 MED ORDER — ROCURONIUM BROMIDE 10 MG/ML (PF) SYRINGE
PREFILLED_SYRINGE | INTRAVENOUS | Status: DC | PRN
  Administered 2021-12-20: 20 mg via INTRAVENOUS
  Administered 2021-12-20: 60 mg via INTRAVENOUS

## 2021-12-20 MED ORDER — ONDANSETRON HCL 4 MG/2ML IJ SOLN
INTRAMUSCULAR | Status: DC | PRN
  Administered 2021-12-20 (×2): 4 mg via INTRAVENOUS

## 2021-12-20 MED ORDER — SUGAMMADEX SODIUM 200 MG/2ML IV SOLN
INTRAVENOUS | Status: DC | PRN
  Administered 2021-12-20: 200 mg via INTRAVENOUS

## 2021-12-20 MED ORDER — SODIUM CHLORIDE 0.9% FLUSH
3.0000 mL | Freq: Two times a day (BID) | INTRAVENOUS | Status: DC
Start: 2021-12-20 — End: 2021-12-21
  Administered 2021-12-20: 3 mL via INTRAVENOUS

## 2021-12-20 MED ORDER — PANTOPRAZOLE SODIUM 40 MG PO TBEC
40.0000 mg | DELAYED_RELEASE_TABLET | Freq: Every day | ORAL | Status: DC
  Administered 2021-12-20 – 2021-12-21 (×2): 40 mg via ORAL
  Filled 2021-12-20 (×2): qty 1

## 2021-12-20 MED ORDER — DEXAMETHASONE SODIUM PHOSPHATE 10 MG/ML IJ SOLN
INTRAMUSCULAR | Status: DC | PRN
  Administered 2021-12-20: 8 mg via INTRAVENOUS

## 2021-12-20 MED ORDER — FENTANYL CITRATE (PF) 250 MCG/5ML IJ SOLN
INTRAMUSCULAR | Status: DC | PRN
  Administered 2021-12-20: 100 ug via INTRAVENOUS

## 2021-12-20 MED ORDER — RIVAROXABAN 20 MG PO TABS
20.0000 mg | ORAL_TABLET | Freq: Every day | ORAL | Status: DC
  Administered 2021-12-20: 20 mg via ORAL
  Filled 2021-12-20: qty 1

## 2021-12-20 MED ORDER — FLECAINIDE ACETATE 50 MG PO TABS
100.0000 mg | ORAL_TABLET | Freq: Two times a day (BID) | ORAL | Status: DC
  Administered 2021-12-20 – 2021-12-21 (×2): 100 mg via ORAL
  Filled 2021-12-20 (×2): qty 2

## 2021-12-20 MED ORDER — SODIUM CHLORIDE 0.9 % IV SOLN: INTRAVENOUS | Status: DC

## 2021-12-20 MED ORDER — PHENYLEPHRINE 80 MCG/ML (10ML) SYRINGE FOR IV PUSH (FOR BLOOD PRESSURE SUPPORT)
PREFILLED_SYRINGE | INTRAVENOUS | Status: DC | PRN
Start: 2021-12-20 — End: 2021-12-20
  Administered 2021-12-20: 80 ug via INTRAVENOUS

## 2021-12-20 SURGICAL SUPPLY — 20 items
BLANKET WARM UNDERBOD FULL ACC (MISCELLANEOUS) ×2 IMPLANT
CATH 8FR REPROCESSED SOUNDSTAR (CATHETERS) IMPLANT
CATH 8FR SOUNDSTAR REPROCESSED (CATHETERS) IMPLANT
CATH OCTARAY 2.0 F 3-3-3-3-3 (CATHETERS) ×1 IMPLANT
CATH S CIRCA THERM PROBE 10F (CATHETERS) ×1 IMPLANT
CATH SMTCH THERMOCOOL SF DF (CATHETERS) ×1 IMPLANT
CATH SOUNDSTAR ECO 8FR (CATHETERS) ×1 IMPLANT
CATH WEB BI DIR CSDF CRV REPRO (CATHETERS) ×1 IMPLANT
CLOSURE PERCLOSE PROSTYLE (VASCULAR PRODUCTS) ×3 IMPLANT
COVER SWIFTLINK CONNECTOR (BAG) ×2 IMPLANT
PACK EP LATEX FREE (CUSTOM PROCEDURE TRAY) ×2
PACK EP LF (CUSTOM PROCEDURE TRAY) ×1 IMPLANT
PAD DEFIB RADIO PHYSIO CONN (PAD) ×2 IMPLANT
PATCH CARTO3 (PAD) ×1 IMPLANT
SHEATH BAYLIS TRANSSEPTAL 98CM (NEEDLE) ×1 IMPLANT
SHEATH CARTO VIZIGO SM CVD (SHEATH) ×1 IMPLANT
SHEATH PINNACLE 8F 10CM (SHEATH) ×2 IMPLANT
SHEATH PINNACLE 9F 10CM (SHEATH) ×1 IMPLANT
SHEATH PROBE COVER 6X72 (BAG) ×1 IMPLANT
TUBING SMART ABLATE COOLFLOW (TUBING) ×1 IMPLANT

## 2021-12-20 NOTE — H&P (Signed)
Electrophysiology Office Note:     Date:  12/20/2021    ID:  [name], DOB 12/15/1955, MRN [id number]   PCP:  Cesar Dimitri, MD            [facility] Cardiologist:  None  [facility] Electrophysiologist:  Lanier Prude, MD    Referring MD: Cesar Goltz, PA    Chief Complaint: Consult for Afib ablation   History of Present Illness:     Cesar Wheeler is a 66 y.o. male who presents for an evaluation of possible ablation at the request of Cesar Wheeler, [place]. Their medical history includes atrial fibrillation, and shingles.   He saw Cesar Loa, PA on 08/14/2021. He reported reverting to Afib 08/10/21, persistent until 08/13/21. He had resumed Xarelto at the onset of his symptoms. Also he noted more frequent episodes of Afib for several weeks prior with a typical duration of 2-3 hours. He was referred to EP to discuss atrial fibrillation ablation. His diltiazem was increased to 360 mg daily.   He was diagnosed with Afib several years prior, done well on flecainide. He underwent DCCV on 08/19/2018. However, he had an episode of racing heart rates and leg weakness when getting out of bed on 05/14/2021. He resumed Xarelto that same day.    Overall, he appears well. He states he has been dealing with atrial fibrillation for 10 years.    He remains compliant with flecainide and diltiazem. Since diltiazem was increased in 07/2021, he denies any recurrent episodes of arrhythmia.   He denies any chest pain, shortness of breath, or peripheral edema. No lightheadedness, headaches, syncope, orthopnea, or PND.   Of note, he notes he will be starting Medicare in a few months.   He recently returned from a cruise to the [place].    Plan for PVI today.   Objective        Past Medical History:  Diagnosis Date   Abnormal blood test 02/24/2012   Atrial fibrillation (HCC)     Shingles      X3           Past Surgical History:  Procedure Laterality Date   CARDIOVERSION N/A 08/19/2018     Procedure: CARDIOVERSION;  Surgeon: Cesar Bathe, MD;  Location: [facility] ENDOSCOPY;  Service: Cardiovascular;  Laterality: N/A;   FINGER FRACTURE SURGERY       KNEE ARTHROSCOPY Left     TEE WITHOUT CARDIOVERSION N/A 08/19/2018    Procedure: TRANSESOPHAGEAL ECHOCARDIOGRAM (TEE);  Surgeon: Cesar Bathe, MD;  Location: [facility]OSCOPY;  Service: Cardiovascular;  Laterality: N/A;   TONSILLECTOMY          Current Medications: Active Medications      Current Meds  Medication Sig   atorvastatin (LIPITOR) 10 MG tablet Take 10 mg by mouth daily.   diltiazem (CARDIZEM CD) 360 MG 24 hr capsule Take 1 capsule (360 mg total) by mouth daily.   flecainide (TAMBOCOR) 100 MG tablet TAKE 1 TABLET(100 MG) BY MOUTH EVERY 12 HOURS   fluticasone (FLONASE) 50 MCG/ACT nasal spray Place 1 spray into both nostrils daily as needed for allergies.    Glucosamine-Chondroitin (GLUCOSAMINE CHONDR COMPLEX PO) Take 1 tablet by mouth daily.   meloxicam (MOBIC) 15 MG tablet Take 15 mg by mouth daily.   mupirocin ointment (BACTROBAN) 2 % 1 application   rivaroxaban (XARELTO) 20 MG TABS tablet Take 1 tablet (20 mg total) by mouth daily with supper.  Allergies:   Penicillins and Simvastatin    Social History         Socioeconomic History   Marital status: Married      Spouse name: Not on file   Number of children: Not on file   Years of education: Not on file   Highest education level: Not on file  Occupational History      Employer: [employer]  Tobacco Use   Smoking status: Never   Smokeless tobacco: Never  Vaping Use   Vaping Use: Never used  Substance and Sexual Activity   Alcohol use: No   Drug use: No   Sexual activity: Not on file  Other Topics Concern   Not on file  Social History Narrative   Not on file    Social Determinants of Health    Financial Resource Strain: Not on file  Food Insecurity: Not on file  Transportation Needs: Not on file  Physical Activity: Not on file   Stress: Not on file  Social Connections: Not on file      Family History: The patient's family history includes Cancer - Lung in his father; Cancer - Other in his sister; Diabetes in his mother; Heart disease in his mother.   ROS:   Please see the history of present illness.    All other systems reviewed and are negative.   EKGs/Labs/Other Studies Reviewed:     The following studies were reviewed today:   Echo 09/05/2021:  1. Left ventricular ejection fraction, by estimation, is 60 to 65%. The  left ventricle has normal function. The left ventricle has no regional  wall motion abnormalities. Left ventricular diastolic parameters were  normal.   2. Right ventricular systolic function is normal. The right ventricular  size is normal. There is normal pulmonary artery systolic pressure.   3. The mitral valve is abnormal. Mild mitral valve regurgitation. No  evidence of mitral stenosis.   4. The aortic valve is tricuspid. There is mild calcification of the  aortic valve. Aortic valve regurgitation is trivial. Aortic valve  sclerosis is present, with no evidence of aortic valve stenosis.   5. Aortic dilatation noted. There is mild dilatation of the ascending  aorta, measuring 38 mm.   6. The inferior vena cava is normal in size with greater than 50%  respiratory variability, suggesting right atrial pressure of 3 mmHg.     EKG:   EKG is personally reviewed.  10/02/2021: EKG was not ordered.     Recent Labs: 05/15/2021: B Natriuretic Peptide 85.5; BUN 18; Creatinine, Ser 1.13; Hemoglobin 14.7; Magnesium 2.0; Platelets 191; Potassium 3.7; Sodium 138    Recent Lipid Panel Labs (Brief)  No results found for: CHOL, TRIG, HDL, CHOLHDL, VLDL, LDLCALC, LDLDIRECT     Physical Exam:     VS:  BP 142/88   Pulse 63   Ht 5\' 11"  (1.803 m)   Wt 244 lb 6.4 oz (110.9 kg)   SpO2 97%   BMI 34.09 kg/m         Wt Readings from Last 3 Encounters:  10/02/21 244 lb 6.4 oz (110.9 kg)   08/28/21 245 lb (111.1 kg)  08/14/21 238 lb (108 kg)      GEN: Well nourished, well developed in no acute distress HEENT: Normal NECK: No JVD; No carotid bruits LYMPHATICS: No lymphadenopathy CARDIAC: RRR, no murmurs, rubs, gallops RESPIRATORY:  Clear to auscultation without rales, wheezing or rhonchi  ABDOMEN: Soft, non-tender, non-distended MUSCULOSKELETAL:  No edema;  No deformity  SKIN: Warm and dry NEUROLOGIC:  Alert and oriented x 3 PSYCHIATRIC:  Normal affect          Assessment     ASSESSMENT:     1. Persistent atrial fibrillation (HCC)   2. Typical atrial flutter (HCC)     PLAN:     In order of problems listed above:   #Persistent AF Symptomatic despite treatment with flecainide, diltiazem. On xarelto for stroke ppx. I discussed the procedure in detail with the patient including the risks, recovery and likelihood of success. He would like to proceed with scheduling.   Risk, benefits, and alternatives to EP study and radiofrequency ablation for afib were also discussed in detail today. These risks include but are not limited to stroke, bleeding, vascular damage, tamponade, perforation, damage to the esophagus, lungs, and other structures, pulmonary vein stenosis, worsening renal function, and death. The patient understands these risk and wishes to proceed.  We will therefore proceed with catheter ablation at the next available time.  Carto, ICE, anesthesia are requested for the procedure.  Will also obtain CT PV protocol prior to the procedure to exclude LAA thrombus and further evaluate atrial anatomy.    Plan for PVI today. Procedure reviewed.   Signed, Rossie Muskrat. Lalla Brothers, MD, [facility], [facility] 12/20/2021 Electrophysiology [facility]

## 2021-12-20 NOTE — Anesthesia Postprocedure Evaluation (Signed)
Anesthesia Post Note  Patient: Cesar Wheeler  Procedure(s) Performed: ATRIAL FIBRILLATION ABLATION     Patient location during evaluation: PACU Anesthesia Type: General Level of consciousness: awake and alert Pain management: pain level controlled Vital Signs Assessment: post-procedure vital signs reviewed and stable Respiratory status: spontaneous breathing, nonlabored ventilation, respiratory function stable and patient connected to nasal cannula oxygen Cardiovascular status: blood pressure returned to baseline and stable Postop Assessment: no apparent nausea or vomiting Anesthetic complications: no   No notable events documented.  Last Vitals:  Vitals:   12/20/21 1710 12/20/21 1722  BP: 108/68   Pulse: 71 67  Resp: 12 13  Temp:    SpO2: 93% 94%    Last Pain:  Vitals:   12/20/21 1641  TempSrc: Temporal  PainSc: 0-No pain                 Kennieth Rad

## 2021-12-20 NOTE — Transfer of Care (Signed)
Immediate Anesthesia Transfer of Care Note  Patient: Cesar Wheeler  Procedure(s) Performed: ATRIAL FIBRILLATION ABLATION  Patient Location: PACU and Cath Lab  Anesthesia Type:General  Level of Consciousness: drowsy, patient cooperative and responds to stimulation  Airway & Oxygen Therapy: Patient Spontanous Breathing and Patient connected to nasal cannula oxygen  Post-op Assessment: Report given to RN and Post -op Vital signs reviewed and stable  Post vital signs: Reviewed and stable  Last Vitals:  Vitals Value Taken Time  BP 119/77 12/20/21 1610  Temp    Pulse 75 12/20/21 1611  Resp 13 12/20/21 1611  SpO2 97 % 12/20/21 1611  Vitals shown include unvalidated device data.  Last Pain:  Vitals:   12/20/21 1147  TempSrc:   PainSc: 0-No pain         Complications: No notable events documented.

## 2021-12-20 NOTE — Plan of Care (Signed)

## 2021-12-20 NOTE — Anesthesia Preprocedure Evaluation (Signed)
Anesthesia Evaluation  Patient identified by MRN, date of birth, ID band Patient awake    Reviewed: Allergy & Precautions, NPO status , Patient's Chart, lab work & pertinent test results  Airway Mallampati: II  TM Distance: >3 FB Neck ROM: Full    Dental  (+) Dental Advisory Given   Pulmonary sleep apnea ,    breath sounds clear to auscultation       Cardiovascular hypertension, Pt. on home beta blockers and Pt. on medications + dysrhythmias Atrial Fibrillation  Rhythm:Regular Rate:Normal     Neuro/Psych negative neurological ROS     GI/Hepatic Neg liver ROS, GERD  ,  Endo/Other  negative endocrine ROS  Renal/GU negative Renal ROS     Musculoskeletal   Abdominal   Peds  Hematology negative hematology ROS (+)   Anesthesia Other Findings   Reproductive/Obstetrics                             Anesthesia Physical Anesthesia Plan  ASA: 2  Anesthesia Plan: General   Post-op Pain Management: Tylenol PO (pre-op)* and Minimal or no pain anticipated   Induction: Intravenous  PONV Risk Score and Plan: 2 and Dexamethasone, Ondansetron and Treatment may vary due to age or medical condition  Airway Management Planned: Oral ETT  Additional Equipment: None  Intra-op Plan:   Post-operative Plan: Extubation in OR  Informed Consent: I have reviewed the patients History and Physical, chart, labs and discussed the procedure including the risks, benefits and alternatives for the proposed anesthesia with the patient or authorized representative who has indicated his/her understanding and acceptance.     Dental advisory given  Plan Discussed with:   Anesthesia Plan Comments:         Anesthesia Quick Evaluation

## 2021-12-20 NOTE — Anesthesia Procedure Notes (Signed)
Procedure Name: Intubation Date/Time: 12/20/2021 2:01 PM Performed by: Inda Coke, CRNA Pre-anesthesia Checklist: Patient identified, Emergency Drugs available, Suction available and Patient being monitored Patient Re-evaluated:Patient Re-evaluated prior to induction Oxygen Delivery Method: Circle System Utilized Preoxygenation: Pre-oxygenation with 100% oxygen Induction Type: IV induction Ventilation: Mask ventilation without difficulty Laryngoscope Size: Mac and 4 Grade View: Grade II Tube type: Oral Tube size: 8.0 mm Number of attempts: 1 Airway Equipment and Method: Stylet and Oral airway Placement Confirmation: ETT inserted through vocal cords under direct vision, positive ETCO2 and breath sounds checked- equal and bilateral Tube secured with: Tape Dental Injury: Teeth and Oropharynx as per pre-operative assessment

## 2021-12-21 ENCOUNTER — Telehealth: Payer: Self-pay | Admitting: Student

## 2021-12-21 ENCOUNTER — Encounter (HOSPITAL_COMMUNITY): Payer: Self-pay | Admitting: Cardiology

## 2021-12-21 DIAGNOSIS — I4819 Other persistent atrial fibrillation: Secondary | ICD-10-CM | POA: Diagnosis not present

## 2021-12-21 DIAGNOSIS — K219 Gastro-esophageal reflux disease without esophagitis: Secondary | ICD-10-CM | POA: Diagnosis not present

## 2021-12-21 DIAGNOSIS — I10 Essential (primary) hypertension: Secondary | ICD-10-CM | POA: Diagnosis not present

## 2021-12-21 DIAGNOSIS — Z8619 Personal history of other infectious and parasitic diseases: Secondary | ICD-10-CM | POA: Diagnosis not present

## 2021-12-21 DIAGNOSIS — G4730 Sleep apnea, unspecified: Secondary | ICD-10-CM | POA: Diagnosis not present

## 2021-12-21 DIAGNOSIS — Z79899 Other long term (current) drug therapy: Secondary | ICD-10-CM | POA: Diagnosis not present

## 2021-12-21 DIAGNOSIS — Z7901 Long term (current) use of anticoagulants: Secondary | ICD-10-CM | POA: Diagnosis not present

## 2021-12-21 DIAGNOSIS — I483 Typical atrial flutter: Secondary | ICD-10-CM | POA: Diagnosis not present

## 2021-12-21 MED ORDER — DILTIAZEM HCL ER COATED BEADS 180 MG PO CP24
180.0000 mg | ORAL_CAPSULE | Freq: Every day | ORAL | 3 refills | Status: DC
Start: 2021-12-21 — End: 2022-11-21

## 2021-12-21 MED ORDER — PANTOPRAZOLE SODIUM 40 MG PO TBEC: 40.0000 mg | DELAYED_RELEASE_TABLET | Freq: Every day | ORAL | 0 refills | Status: DC

## 2021-12-21 MED ORDER — COLCHICINE 0.6 MG PO TABS: 0.6000 mg | ORAL_TABLET | Freq: Two times a day (BID) | ORAL | 0 refills | Status: DC

## 2021-12-21 NOTE — Telephone Encounter (Signed)
Returned call to [employer].  Advised Pt was being discharged from hospital today on dilitiazem 180 mg PO daily.  Ok to DC diltiazem 360 po daily.

## 2021-12-21 NOTE — Discharge Summary (Signed)
ELECTROPHYSIOLOGY PROCEDURE DISCHARGE SUMMARY    Patient ID: Cesar Wheeler,  MRN: [id number], DOB/AGE: 1955-09-16 66 y.o.  Admit date: 12/20/2021 Discharge date: 12/21/2021  Primary Care Physician: Cari Caraway, MD  Primary Cardiologist: None  Electrophysiologist: Dr. Quentin Ore  Primary Discharge Diagnosis:  Persistent Atrial Fibrillation Typical atrial flutter   Procedures This Admission:  1.  Electrophysiology study and radiofrequency catheter ablation of persistent Atrial Fibrillation and typical atrial flutter on 12/20/2021 by  Dr. Quentin Ore .  This study demonstrated: 1. Successful PVI 2. Successful ablation/isolation of the posterior wall 3. Successful ablation of the cavotricuspid isthmus for typical atrial flutter 4. Intracardiac echo reveals trivial pericardial effusion, normal LA architecture 5. No early apparent complications. 6. Colchicine 0.6mg  PO BID x 5 days 7. Protonix 40mg  PO daily x 45 days  Brief HPI: Cesar Wheeler is a 66 y.o. male with a history of persistent atrial fibrillation and atrial flutter. They have failed medical therapy with flecainide. Risks, benefits, and alternatives to catheter ablation of Atrial Fibrillation and flutter were reviewed with the patient who wished to proceed.  The patient did not miss any doses of Xarelto, and TEE prior to the procedure was not required.   Hospital Course:  The patient was admitted and underwent EPS/RFCA of Atrial Fibrillation and flutter with details as outlined above.  They were monitored on telemetry overnight which demonstrated NSR.  Groin was without complication on the day of discharge.  The patient was examined and considered to be stable for discharge.  Wound care and restrictions were reviewed with the patient.  The patient will be seen back by Adline Peals, PA in 4 weeks and  Dr. Quentin Ore  in 12 weeks for post ablation follow up.   This patients CHA2DS2-VASc Score and unadjusted Ischemic Stroke Rate (% per  year) is at least equal to 0.6 % stroke rate/year from a score of 1 Above score calculated as 1 point each if present [CHF, HTN, DM, Vascular=MI/PAD/Aortic Plaque, Age if 65-74, or Male] Above score calculated as 2 points each if present [Age > 75, or Stroke/TIA/TE]    Physical Exam: Vitals:   12/20/21 1747 12/20/21 1823 12/20/21 2010 12/21/21 0621  BP:  129/79 113/75 110/77  Pulse:   94 79  Resp:   18 18  Temp: (!) 97.5 F (36.4 C)  97.9 F (36.6 C) 98.3 F (36.8 C)  TempSrc: Oral  Oral Oral  SpO2:   95% 96%  Weight: 111.9 kg     Height: 5\' 11"  (1.803 m)       GEN- The patient is well appearing, alert and oriented x 3 today.   HEENT: normocephalic, atraumatic; sclera clear, conjunctiva pink; hearing intact; oropharynx clear; neck supple  Lungs- Clear to ausculation bilaterally, normal work of breathing.  No wheezes, rales, rhonchi Heart- Regular rate and rhythm, no murmurs, rubs or gallops  GI- soft, non-tender, non-distended, bowel sounds present  Extremities- no clubbing, cyanosis, or edema; DP/PT/radial pulses 2+ bilaterally, groin without hematoma/bruit MS- no significant deformity or atrophy Skin- warm and dry, no rash or lesion Psych- euthymic mood, full affect Neuro- strength and sensation are intact   Labs:   Lab Results  Component Value Date   WBC 6.2 12/13/2021   HGB 15.3 12/13/2021   HCT 45.5 12/13/2021   MCV 90 12/13/2021   PLT 194 12/13/2021   No results for input(s): NA, K, CL, CO2, BUN, CREATININE, CALCIUM, PROT, BILITOT, ALKPHOS, ALT, AST, GLUCOSE in the last 168 hours.  Invalid input(s): LABALBU   Discharge Medications:  Allergies as of 12/21/2021       Reactions   Penicillins Other (See Comments)   Childhood allergy, almost died   Simvastatin    Other reaction(s): AFIB        Medication List     TAKE these medications    atorvastatin 10 MG tablet Commonly known as: LIPITOR Take 10 mg by mouth daily.   colchicine 0.6 MG  tablet Take 1 tablet (0.6 mg total) by mouth 2 (two) times daily for 5 days.   diltiazem 180 MG 24 hr capsule Commonly known as: CARDIZEM CD Take 1 capsule (180 mg total) by mouth daily. What changed:  medication strength how much to take how to take this when to take this additional instructions   flecainide 100 MG tablet Commonly known as: TAMBOCOR TAKE 1 TABLET(100 MG) BY MOUTH EVERY 12 HOURS   fluticasone 50 MCG/ACT nasal spray Commonly known as: FLONASE Place 1 spray into both nostrils daily as needed for allergies.   GLUCOSAMINE CHONDR COMPLEX PO Take 1 tablet by mouth daily.   meloxicam 15 MG tablet Commonly known as: MOBIC Take 15 mg by mouth daily.   metoprolol tartrate 50 MG tablet Commonly known as: LOPRESSOR Take 50mg  tablet 2 hours prior to CT Scan   pantoprazole 40 MG tablet Commonly known as: PROTONIX Take 1 tablet (40 mg total) by mouth daily.   predniSONE 10 MG tablet Commonly known as: DELTASONE Take 0.5 tablets (5 mg total) by mouth daily with breakfast.   rivaroxaban 20 MG Tabs tablet Commonly known as: XARELTO Take 1 tablet (20 mg total) by mouth daily with supper.        Disposition:    Follow-up Information     [facility] Follow up.   Specialty: Cardiology Why: on 6/29 at 1130 for post ablation check Contact information: [address] [id number] [place] [id number] [phone]                Duration of Discharge Encounter: Greater than 30 minutes including physician time.  Jacalyn Lefevre, PA-C  12/21/2021 9:06 AM

## 2021-12-21 NOTE — Telephone Encounter (Signed)
[facility] called wanting to know if Cesar Wheeler wants to discontinue  diltiazem (CARDIZEM CD) 360 MG 24 hr capsule, since he sent in a script for diltiazem (CARDIZEM CD) 180 MG 24 hr capsule.

## 2021-12-21 NOTE — TOC Transition Note (Signed)
Transition of Care [facility]) - CM/SW Discharge Note   Patient Details  Name: Cesar Wheeler DOBRATZ MRN: [id number] Date of Birth: March 28, 1956  [facility] CM/SW Contact:  Bess Kinds, RN Phone Number: [phone] 12/21/2021, 10:32 AM   Clinical Narrative:     Patient to transition home today. No TOC needs identified at this time.    Final next level of care: Home/Self Care Barriers to Discharge: No Barriers Identified   Patient Goals and CMS Choice        Discharge Placement                       Discharge Plan and Services                                     Social Determinants of Health (SDOH) Interventions     Readmission Risk Interventions     View : No data to display.

## 2021-12-21 NOTE — Discharge Instructions (Signed)

## 2021-12-22 ENCOUNTER — Telehealth: Payer: Self-pay | Admitting: Student in an Organized Health Care Education/Training Program

## 2021-12-22 NOTE — Telephone Encounter (Signed)
Paged by operator about bruising post PVI. 1.5 in R groin, none expanding and no reported hematoma. No bleeding. Occasional hiccups which are new but intermittent and may be assoicated with phrenic nerve spasm post PVI. No changes right now, he knows to call if bruise enlarges/becomes swollen/has acute bleeding.

## 2022-01-17 ENCOUNTER — Ambulatory Visit (HOSPITAL_COMMUNITY)
Admission: RE | Admit: 2022-01-17 | Discharge: 2022-01-17 | Disposition: A | Discharge status: Home / Self Care | Payer: Medicare Other | Source: Ambulatory Visit | Attending: Physician Assistant | Admitting: Physician Assistant

## 2022-01-17 ENCOUNTER — Encounter (HOSPITAL_COMMUNITY): Payer: Self-pay | Admitting: Physician Assistant

## 2022-01-17 VITALS — BP 114/84 | HR 59 | Ht 71.0 in | Wt 238.2 lb

## 2022-01-17 DIAGNOSIS — Z6833 Body mass index (BMI) 33.0-33.9, adult: Secondary | ICD-10-CM | POA: Insufficient documentation

## 2022-01-17 DIAGNOSIS — E669 Obesity, unspecified: Secondary | ICD-10-CM | POA: Insufficient documentation

## 2022-01-17 DIAGNOSIS — Z79899 Other long term (current) drug therapy: Secondary | ICD-10-CM | POA: Diagnosis not present

## 2022-01-17 DIAGNOSIS — Z7901 Long term (current) use of anticoagulants: Secondary | ICD-10-CM | POA: Diagnosis not present

## 2022-01-17 DIAGNOSIS — Z7182 Exercise counseling: Secondary | ICD-10-CM | POA: Diagnosis not present

## 2022-01-17 DIAGNOSIS — I4819 Other persistent atrial fibrillation: Secondary | ICD-10-CM | POA: Diagnosis not present

## 2022-01-17 DIAGNOSIS — I4892 Unspecified atrial flutter: Secondary | ICD-10-CM | POA: Diagnosis not present

## 2022-01-17 NOTE — Progress Notes (Signed)
Primary Care Physician: Gweneth Dimitri, MD Primary Cardiologist: Dr Mayford Knife Primary EP: Dr Lalla Brothers Referring Physician: Dr Legrand Pitts is a 66 y.o. male with a history of paroxysmal atrial fibrillation who presents for follow up in the [facility]. He was diagnosed atrial fibrillation several years ago and done well on flecainide therapy. He reports that he only has episodes 1-2 times per year and these episodes only last for a few minutes. He is now s/p TEE/DCCV on 08/19/18. Patient had done very well from an afib standpoint until 05/14/21 when he noted heart racing and leg weakness as he was getting in bed. He presented to the ED for evaluation but his symptoms abated and he left before being seen. He resumed his Xarelto that same day. He was seen in the AF clinic and DCCV was arranged but he spontaneously converted and the procedure was cancelled.   Patient reports he went into afib on 08/10/21 and was persistent until 08/13/21. He resumed his Xarelto at the onset of his symptoms. Patient is now s/p afib and flutter ablation with Dr Lalla Brothers 12/20/21.  On follow up today, patient reports he has done very well since the ablation. He has not had any afib episodes. He denies CP, swallowing pain, or groin issues.   Today, he denies symptoms of palpitations, chest pain, orthopnea, PND, lower extremity edema, dizziness, presyncope, syncope, bleeding, or neurologic sequela. The patient is tolerating medications without difficulties and is otherwise without complaint today.    Atrial Fibrillation Risk Factors:  he does have symptoms or diagnosis of sleep apnea.  He is not on CPAP therapy. he does not have a history of rheumatic fever. he does not have a history of alcohol use. The patient does have a history of early familial atrial fibrillation or other arrhythmias.  he has a BMI of Body mass index is 33.22 kg/m.Marland Kitchen Filed Weights   01/17/22 1116  Weight: 108  kg     Atrial Fibrillation Management history:  Previous antiarrhythmic drugs: flecainide Previous cardioversions: 02/23/12, 08/19/18 Previous ablations: 12/20/21 CHADS2VASC score: 1 Anticoagulation history: Xarelto  Past Medical History:  Diagnosis Date   Abnormal blood test 02/24/2012   Atrial fibrillation (HCC)    Shingles    X3   Past Surgical History:  Procedure Laterality Date   ATRIAL FIBRILLATION ABLATION N/A 12/20/2021   Procedure: ATRIAL FIBRILLATION ABLATION;  Surgeon: Lanier Prude, MD;  [facility] CV LAB;  Service: Cardiovascular;  Laterality: N/A;   CARDIOVERSION N/A 08/19/2018   Procedure: CARDIOVERSION;  Surgeon: Jake Bathe, MD;  Location: [facility] ENDOSCOPY;  Service: Cardiovascular;  Laterality: N/A;   FINGER FRACTURE SURGERY     KNEE ARTHROSCOPY Left    TEE WITHOUT CARDIOVERSION N/A 08/19/2018   Procedure: TRANSESOPHAGEAL ECHOCARDIOGRAM (TEE);  Surgeon: Jake Bathe, MD;  Location: [facility] ENDOSCOPY;  Service: Cardiovascular;  Laterality: N/A;   TONSILLECTOMY      Current Outpatient Medications  Medication Sig Dispense Refill   atorvastatin (LIPITOR) 10 MG tablet Take 10 mg by mouth daily.  0   diltiazem (CARDIZEM CD) 180 MG 24 hr capsule Take 1 capsule (180 mg total) by mouth daily. 90 capsule 3   flecainide (TAMBOCOR) 100 MG tablet TAKE 1 TABLET(100 MG) BY MOUTH EVERY 12 HOURS 180 tablet 1   fluticasone (FLONASE) 50 MCG/ACT nasal spray Place 1 spray into both nostrils daily as needed for allergies.      Glucosamine-Chondroitin (GLUCOSAMINE CHONDR COMPLEX PO)  Take 1 tablet by mouth daily.     rivaroxaban (XARELTO) 20 MG TABS tablet Take 1 tablet (20 mg total) by mouth daily with supper. 90 tablet 3   colchicine 0.6 MG tablet Take 1 tablet (0.6 mg total) by mouth 2 (two) times daily for 5 days. 10 tablet 0   No current facility-administered medications for this encounter.    Allergies  Allergen Reactions   Penicillins Other (See Comments)     Childhood allergy, almost died   Simvastatin     Other reaction(s): AFIB    Social History   Socioeconomic History   Marital status: Married    Spouse name: Not on file   Number of children: Not on file   Years of education: Not on file   Highest education level: Not on file  Occupational History    Employer: [employer]  Tobacco Use   Smoking status: Never   Smokeless tobacco: Never   Tobacco comments:    Never smoke 01/17/22  Vaping Use   Vaping Use: Never used  Substance and Sexual Activity   Alcohol use: No   Drug use: No   Sexual activity: Not on file  Other Topics Concern   Not on file  Social History Narrative   Not on file   Social Determinants of Health   Financial Resource Strain: Not on file  Food Insecurity: Not on file  Transportation Needs: Not on file  Physical Activity: Not on file  Stress: Not on file  Social Connections: Not on file  Intimate Partner Violence: Not on file    Family History  Problem Relation Age of Onset   Heart disease Mother    Diabetes Mother    Cancer - Lung Father    Cancer - Other Sister      ROS- All systems are reviewed and negative except as per the HPI above.  Physical Exam: Vitals:   01/17/22 1116  BP: 114/84  Pulse: (!) 59  Weight: 108 kg  Height: 5\' 11"  (1.803 m)    GEN- The patient is a well appearing obese male, alert and oriented x 3 today.   HEENT-head normocephalic, atraumatic, sclera clear, conjunctiva pink, hearing intact, trachea midline. Lungs- Clear to ausculation bilaterally, normal work of breathing Heart- Regular rate and rhythm, no murmurs, rubs or gallops  GI- soft, NT, ND, + BS Extremities- no clubbing, cyanosis, or edema MS- no significant deformity or atrophy Skin- no rash or lesion Psych- euthymic mood, full affect Neuro- strength and sensation are intact   Wt Readings from Last 3 Encounters:  01/17/22 108 kg  12/20/21 111.9 kg  10/02/21 110.9 kg    EKG today  demonstrates  SB, 1st degree AV block Vent. rate 59 BPM PR interval 210 ms QRS duration 104 ms QT/QTcB 448/443 ms  Echo 09/05/21  1. Left ventricular ejection fraction, by estimation, is 60 to 65%. The  left ventricle has normal function. The left ventricle has no regional  wall motion abnormalities. Left ventricular diastolic parameters were  normal.   2. Right ventricular systolic function is normal. The right ventricular  size is normal. There is normal pulmonary artery systolic pressure.   3. The mitral valve is abnormal. Mild mitral valve regurgitation. No  evidence of mitral stenosis.   4. The aortic valve is tricuspid. There is mild calcification of the  aortic valve. Aortic valve regurgitation is trivial. Aortic valve  sclerosis is present, with no evidence of aortic valve stenosis.  5. Aortic dilatation noted. There is mild dilatation of the ascending  aorta, measuring 38 mm.   6. The inferior vena cava is normal in size with greater than 50%  respiratory variability, suggesting right atrial pressure of 3 mmHg.   Epic records are reviewed at length today  CHA2DS2-VASc Score = 1  The patient's score is based upon: CHF History: 0 HTN History: 0 Diabetes History: 0 Stroke History: 0 Vascular Disease History: 0 Age Score: 1 Gender Score: 0        ASSESSMENT AND PLAN: 1. Persistent Atrial Fibrillation/atrial flutter The patient's CHA2DS2-VASc score is 1, indicating a 0.6% annual risk of stroke.   S/p afib and flutter ablation 12/20/21 Patient appears to be maintaining SR. Continue diltiazem 180 mg daily Continue flecainide 100 mg BID Continue Xarelto 20 mg daily with no missed doses for 3 months post ablation.   2. Obesity Body mass index is 33.22 kg/m. Lifestyle modification was discussed and encouraged including regular physical activity and weight reduction.   Follow up with Dr Lalla Brothers as scheduled.    [name] PA-C Afib Clinic [facility] [address] [place], [place] [address] [phone] 01/17/2022 11:38 AM

## 2022-02-03 ENCOUNTER — Other Ambulatory Visit (HOSPITAL_COMMUNITY): Payer: Self-pay | Admitting: Physician Assistant

## 2022-02-03 DIAGNOSIS — I480 Paroxysmal atrial fibrillation: Secondary | ICD-10-CM

## 2022-02-06 DIAGNOSIS — L739 Follicular disorder, unspecified: Secondary | ICD-10-CM | POA: Diagnosis not present

## 2022-03-20 ENCOUNTER — Encounter: Payer: Self-pay | Admitting: Cardiology

## 2022-03-20 ENCOUNTER — Ambulatory Visit: Payer: Medicare Other | Attending: Cardiology | Admitting: Cardiology

## 2022-03-20 VITALS — BP 112/70 | HR 69 | Ht 71.0 in | Wt 243.2 lb

## 2022-03-20 DIAGNOSIS — I4819 Other persistent atrial fibrillation: Secondary | ICD-10-CM

## 2022-03-20 DIAGNOSIS — G4733 Obstructive sleep apnea (adult) (pediatric): Secondary | ICD-10-CM | POA: Diagnosis not present

## 2022-03-20 DIAGNOSIS — I484 Atypical atrial flutter: Secondary | ICD-10-CM

## 2022-03-20 MED ORDER — ASPIRIN 81 MG PO TBEC: 81.0000 mg | DELAYED_RELEASE_TABLET | Freq: Every day | ORAL | 3 refills | Status: AC

## 2022-03-20 NOTE — Progress Notes (Signed)
Electrophysiology Office Follow up Visit Note:    Date:  03/20/2022   ID:  Cesar Wheeler, DOB 1955-11-04, MRN [id number]  PCP:  Gweneth Dimitri, MD  [facility] Cardiologist:  None  [facility] Electrophysiologist:  Lanier Prude, MD    Interval History:    Cesar Wheeler is a 66 y.o. male who presents for a follow up visit.  The patient had an atrial fibrillation ablation on December 20, 2021.  During that procedure, the veins, posterior wall and CTI were ablated.  The patient is all Clide [place] in the A-fib clinic on January 17, 2022.  At that appointment he reported feeling well without recurrence of his arrhythmia.  He has taken flecainide in the past for rhythm control.   Today:  His breathing and heart rate have been "great."  His groin site has healed well. Since that time, he has had small "bumps" on his midsection. He went to [facility] and received doxycycline, but he didn't finish the medication dose. This cleared up the bumps. A week later, the bumps returned, and he took the rest of the medication.  He remains compliant on Xarelto. As he works with his hands, he often has small cuts on them which bleed more than he would like.   He remains compliant on the Dithiazide, but he is interested in stopping the medication.   He has completely cut out caffeine since he began having afib. He has not drank alcohol in many years.   He has a small knot on the front of his leg, which he believes may be from knocking it on something.  He denies any palpitations, chest pain, shortness of breath, or peripheral edema. No lightheadedness, headaches, syncope, orthopnea, or PND.   Past Medical History:  Diagnosis Date   Abnormal blood test 02/24/2012   Atrial fibrillation (HCC)    Shingles    X3    Past Surgical History:  Procedure Laterality Date   ATRIAL FIBRILLATION ABLATION N/A 12/20/2021   Procedure: ATRIAL FIBRILLATION ABLATION;  Surgeon: Lanier Prude, MD;  [facility] IN[facility]  CV LAB;  Service: Cardiovascular;  Laterality: N/A;   CARDIOVERSION N/A 08/19/2018   Procedure: CARDIOVERSION;  Surgeon: Jake Bathe, MD;  Location: [facility] ENDOSCOPY;  Service: Cardiovascular;  Laterality: N/A;   FINGER FRACTURE SURGERY     KNEE ARTHROSCOPY Left    TEE WITHOUT CARDIOVERSION N/A 08/19/2018   Procedure: TRANSESOPHAGEAL ECHOCARDIOGRAM (TEE);  Surgeon: Jake Bathe, MD;  Location: [facility] ENDOSCOPY;  Service: Cardiovascular;  Laterality: N/A;   TONSILLECTOMY      Current Medications: Current Meds  Medication Sig   atorvastatin (LIPITOR) 10 MG tablet Take 10 mg by mouth daily.   diltiazem (CARDIZEM CD) 180 MG 24 hr capsule Take 1 capsule (180 mg total) by mouth daily.   flecainide (TAMBOCOR) 100 MG tablet TAKE 1 TABLET(100 MG) BY MOUTH EVERY 12 HOURS   fluticasone (FLONASE) 50 MCG/ACT nasal spray Place 1 spray into both nostrils daily as needed for allergies.    Glucosamine-Chondroitin (GLUCOSAMINE CHONDR COMPLEX PO) Take 1 tablet by mouth daily.   rivaroxaban (XARELTO) 20 MG TABS tablet Take 1 tablet (20 mg total) by mouth daily with supper.     Allergies:   Penicillins and Simvastatin   Social History   Socioeconomic History   Marital status: Married    Spouse name: Not on file   Number of children: Not on file   Years of education: Not on file   Highest education  level: Not on file  Occupational History    Employer: [employer]  Tobacco Use   Smoking status: Never   Smokeless tobacco: Never   Tobacco comments:    Never smoke 01/17/22  Vaping Use   Vaping Use: Never used  Substance and Sexual Activity   Alcohol use: No   Drug use: No   Sexual activity: Not on file  Other Topics Concern   Not on file  Social History Narrative   Not on file   Social Determinants of Health   Financial Resource Strain: Not on file  Food Insecurity: Not on file  Transportation Needs: Not on file  Physical Activity: Not on file  Stress: Not on file  Social  Connections: Not on file     Family History: The patient's family history includes Cancer - Lung in his father; Cancer - Other in his sister; Diabetes in his mother; Heart disease in his mother.  ROS:   Please see the history of present illness.    (+) Skin bumps   All other systems reviewed and are negative.  EKGs/Labs/Other Studies Reviewed:    The following studies were reviewed today:  Atrial Fibrillation Ablation 12/20/21:  CONCLUSIONS: 1. Successful PVI 2. Successful ablation/isolation of the posterior wall 3. Successful ablation of the cavotricuspid isthmus for typical atrial flutter 4. Intracardiac echo reveals trivial pericardial effusion, normal LA architecture 5. No early apparent complications. 6. Colchicine 0.6mg  PO BID x 5 days 7. Protonix 40mg  PO daily x 45 days  CT Cardiac Morph 12/13/21:  IMPRESSION: 1. There is normal pulmonary vein drainage into the left atrium.   2. 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 There is no thrombus in the left atrial appendage.   3. The esophagus runs in the left atrial midline and is not in the proximity to any of the pulmonary veins.  Echo 09/05/21:  1. Left ventricular ejection fraction, by estimation, is 60 to 65%. The  left ventricle has normal function. The left ventricle has no regional wall motion abnormalities. Left ventricular diastolic parameters were normal.   2. Right ventricular systolic function is normal. The right ventricular size is normal. There is normal pulmonary artery systolic pressure.   3. The mitral valve is abnormal. Mild mitral valve regurgitation. No evidence of mitral stenosis.   4. The aortic valve is tricuspid. There is mild calcification of the aortic valve. Aortic valve regurgitation is trivial. Aortic valve sclerosis is present, with no evidence of aortic valve stenosis.   5. Aortic dilatation noted. There is mild  dilatation of the ascending aorta, measuring 38 mm.   6. The inferior vena cava is normal in size with greater than 50% respiratory variability, suggesting right atrial pressure of 3 mmHg.    Echo Tee 08/19/18:  Impressions:   - Successful cardioversion. No cardiac source of emboli was identified.   EKG:  EKG is personally reviewed  03/20/22: NSR  Recent Labs: 05/15/2021: B Natriuretic Peptide 85.5; Magnesium 2.0 12/13/2021: BUN 16; Creatinine, Ser 1.07; Hemoglobin 15.3; Platelets 194; Potassium 4.8; Sodium 138  Recent Lipid Panel No results found for: "CHOL", "TRIG", "HDL", "CHOLHDL", "VLDL", "LDLCALC", "LDLDIRECT"  Physical Exam:    VS:  BP 112/70   Pulse 69   Ht 5\' 11"  (1.803 m)   Wt 243 lb 3.2 oz (110.3 kg)   SpO2 95%   BMI 33.92 kg/m  Wt Readings from Last 3 Encounters:  03/20/22 243 lb 3.2 oz (110.3 kg)  01/17/22 238 lb 3.2 oz (108 kg)  12/20/21 246 lb 11.1 oz (111.9 kg)     GEN: Well nourished, well developed in no acute distress HEENT: Normal NECK: No JVD; No carotid bruits LYMPHATICS: No lymphadenopathy CARDIAC: RRR, no murmurs, rubs, gallops RESPIRATORY:  Clear to auscultation without rales, wheezing or rhonchi  ABDOMEN: Soft, non-tender, non-distended MUSCULOSKELETAL:  No edema; No deformity  SKIN: Warm and dry NEUROLOGIC:  Alert and oriented x 3 PSYCHIATRIC:  Normal affect        ASSESSMENT:    1. Persistent atrial fibrillation ([place])   2. Atypical atrial flutter (HCC)   3. OSA (obstructive sleep apnea)    PLAN:    In order of problems listed above:  #Persistent atrial fibrillation and flutter On Xarelto for stroke prophylaxis.  CHA2DS2-VASc of 1. Can stop flecainide today. Plan for him to continue Xarelto for another 3 months and discontinue 6 months post ablation.  At that time he should start aspirin 81 mg by mouth once daily.     CHA2DS2-VASc Score = 1  The patient's score is based upon: CHF History: 0 HTN History: 0 Diabetes  History: 0 Stroke History: 0 Vascular Disease History: 0 Age Score: 1 Gender Score: 0   Follow-up in 1 year with APP. He can stop Flecainide today.  Medication Adjustments/Labs and Tests Ordered: Current medicines are reviewed at length with the patient today.  Concerns regarding medicines are outlined above.  No orders of the defined types were placed in this encounter.  No orders of the defined types were placed in this encounter.   I,Mary Mosetta Pigeon Buren,acting as a scribe for Vickie Epley, MD.,have documented all relevant documentation on the behalf of Vickie Epley, MD,as directed by  Vickie Epley, MD while in the presence of Vickie Epley, MD.   I, Vickie Epley, MD, have reviewed all documentation for this visit. The documentation on 03/20/22 for the exam, diagnosis, procedures, and orders are all accurate and complete.    Signed, Lars Mage, MD, [facility], [facility] 03/20/2022 1:49 PM    Electrophysiology [facility]

## 2022-03-20 NOTE — Patient Instructions (Signed)
Medication Instructions:  Stop Flecainide  Stop Xarelto on Dec 1  Start Aspirin on Dec 2 pick up over the counter.  *If you need a refill on your cardiac medications before your next appointment, please call your pharmacy*   Lab Work: none If you have labs (blood work) drawn today and your tests are completely normal, you will receive your results only by: MyChart Message (if you have MyChart) OR A paper copy in the mail If you have any lab test that is abnormal or we need to change your treatment, we will call you to review the results.   Testing/Procedures: None    Follow-Up: At [facility], you and your health needs are our priority.  As part of our continuing mission to provide you with exceptional heart care, we have created designated Provider Care Teams.  These Care Teams include your primary Cardiologist (physician) and Advanced Practice Providers (APPs -  Physician Assistants and Nurse Practitioners) who all work together to provide you with the care you need, when you need it.  We recommend signing up for the patient portal called "MyChart".  Sign up information is provided on this After Visit Summary.  MyChart is used to connect with patients for Virtual Visits (Telemedicine).  Patients are able to view lab/test results, encounter notes, upcoming appointments, etc.  Non-urgent messages can be sent to your provider as well.   To learn more about what you can do with MyChart, go to [email or web address].    Your next appointment:   1 year(s)  The format for your next appointment:   In Person  Provider:   You will see one of the following Advanced Practice Providers on your designated Care Team:   Francis Dowse, [place] Casimiro Needle "Mardelle Matte" Lanna Poche, [place]      Other Instructions none  Important Information About Sugar

## 2022-03-20 NOTE — Progress Notes (Deleted)
Electrophysiology Office Follow up Visit Note:    Date:  03/20/2022   ID:  Cesar Wheeler, DOB 06-20-1956, MRN [id number]  PCP:  Gweneth Dimitri, MD  [facility] Cardiologist:  None  [facility] Electrophysiologist:  Lanier Prude, MD    Interval History:    Cesar Wheeler is a 66 y.o. male who presents for a follow up visit.  The patient had an atrial fibrillation ablation on December 20, 2021.  During that procedure, the veins, posterior wall and CTI were ablated.  The patient is all Cl[place] in the A-fib clinic on January 17, 2022.  At that appointment he reported feeling well without recurrence of his arrhythmia.  He has taken flecainide in the past for rhythm control.       Past Medical History:  Diagnosis Date   Abnormal blood test 02/24/2012   Atrial fibrillation (HCC)    Shingles    X3    Past Surgical History:  Procedure Laterality Date   ATRIAL FIBRILLATION ABLATION N/A 12/20/2021   Procedure: ATRIAL FIBRILLATION ABLATION;  Surgeon: Lanier Prude, MD;  [facility] INVASIVE CV LAB;  Service: Cardiovascular;  Laterality: N/A;   CARDIOVERSION N/A 08/19/2018   Procedure: CARDIOVERSION;  Surgeon: Jake Bathe, MD;  Location: [facility] ENDOSCOPY;  Service: Cardiovascular;  Laterality: N/A;   FINGER FRACTURE SURGERY     KNEE ARTHROSCOPY Left    TEE WITHOUT CARDIOVERSION N/A 08/19/2018   Procedure: TRANSESOPHAGEAL ECHOCARDIOGRAM (TEE);  Surgeon: Jake Bathe, MD;  Location: [facility] ENDOSCOPY;  Service: Cardiovascular;  Laterality: N/A;   TONSILLECTOMY      Current Medications: No outpatient medications have been marked as taking for the 03/20/22 encounter (Appointment) with Lanier Prude, MD.     Allergies:   Penicillins and Simvastatin   Social History   Socioeconomic History   Marital status: Married    Spouse name: Not on file   Number of children: Not on file   Years of education: Not on file   Highest education level: Not on file  Occupational History    Employer:  [employer]  Tobacco Use   Smoking status: Never   Smokeless tobacco: Never   Tobacco comments:    Never smoke 01/17/22  Vaping Use   Vaping Use: Never used  Substance and Sexual Activity   Alcohol use: No   Drug use: No   Sexual activity: Not on file  Other Topics Concern   Not on file  Social History Narrative   Not on file   Social Determinants of Health   Financial Resource Strain: Not on file  Food Insecurity: Not on file  Transportation Needs: Not on file  Physical Activity: Not on file  Stress: Not on file  Social Connections: Not on file     Family History: The patient's family history includes Cancer - Lung in his father; Cancer - Other in his sister; Diabetes in his mother; Heart disease in his mother.  ROS:   Please see the history of present illness.    All other systems reviewed and are negative.  EKGs/Labs/Other Studies Reviewed:    The following studies were reviewed today:    EKG:  The ekg ordered today demonstrates ***  Recent Labs: 05/15/2021: B Natriuretic Peptide 85.5; Magnesium 2.0 12/13/2021: BUN 16; Creatinine, Ser 1.07; Hemoglobin 15.3; Platelets 194; Potassium 4.8; Sodium 138  Recent Lipid Panel No results found for: "CHOL", "TRIG", "HDL", "CHOLHDL", "VLDL", "LDLCALC", "LDLDIRECT"  Physical Exam:    VS:  There were no vitals taken for this visit.    Wt Readings from Last 3 Encounters:  01/17/22 238 lb 3.2 oz (108 kg)  12/20/21 246 lb 11.1 oz (111.9 kg)  10/02/21 244 lb 6.4 oz (110.9 kg)     GEN: *** Well nourished, well developed in no acute distress HEENT: Normal NECK: No JVD; No carotid bruits LYMPHATICS: No lymphadenopathy CARDIAC: ***RRR, no murmurs, rubs, gallops RESPIRATORY:  Clear to auscultation without rales, wheezing or rhonchi  ABDOMEN: Soft, non-tender, non-distended MUSCULOSKELETAL:  No edema; No deformity  SKIN: Warm and dry NEUROLOGIC:  Alert and oriented x 3 PSYCHIATRIC:  Normal affect         ASSESSMENT:    No diagnosis found. PLAN:    In order of problems listed above:  #Persistent atrial fibrillation and flutter On Xarelto for stroke prophylaxis.  CHA2DS2-VASc of 1. Can stop flecainide today. Plan for him to continue Xarelto for another 3 months and discontinue 6 months post ablation.  At that time he should start aspirin 81 mg by mouth once daily.  We discussed rhythm surveillance options during today's appointment including apple/Galaxy watch and loop recorder monitoring.  When he stops anticoagulation, he should employ 1 of these monitoring strategies.  CHA2DS2-VASc Score = 1  The patient's score is based upon: CHF History: 0 HTN History: 0 Diabetes History: 0 Stroke History: 0 Vascular Disease History: 0 Age Score: 1 Gender Score: 0   Follow-up 1 year with APP.  Total time spent with patient today *** minutes. This includes reviewing records, evaluating the patient and coordinating care.   Medication Adjustments/Labs and Tests Ordered: Current medicines are reviewed at length with the patient today.  Concerns regarding medicines are outlined above.  No orders of the defined types were placed in this encounter.  No orders of the defined types were placed in this encounter.    Signed, Steffanie Dunn, MD, [facility], [facility] 03/20/2022 5:17 AM    Electrophysiology [facility]

## 2022-06-04 DIAGNOSIS — R7309 Other abnormal glucose: Secondary | ICD-10-CM | POA: Diagnosis not present

## 2022-06-04 DIAGNOSIS — E782 Mixed hyperlipidemia: Secondary | ICD-10-CM | POA: Diagnosis not present

## 2022-06-04 DIAGNOSIS — R7301 Impaired fasting glucose: Secondary | ICD-10-CM | POA: Diagnosis not present

## 2022-06-04 DIAGNOSIS — Z23 Encounter for immunization: Secondary | ICD-10-CM | POA: Diagnosis not present

## 2022-06-04 DIAGNOSIS — Z0000 Encounter for general adult medical examination without abnormal findings: Secondary | ICD-10-CM | POA: Diagnosis not present

## 2022-06-04 DIAGNOSIS — B359 Dermatophytosis, unspecified: Secondary | ICD-10-CM | POA: Diagnosis not present

## 2022-06-04 DIAGNOSIS — J309 Allergic rhinitis, unspecified: Secondary | ICD-10-CM | POA: Diagnosis not present

## 2022-06-12 ENCOUNTER — Other Ambulatory Visit (HOSPITAL_COMMUNITY): Payer: Self-pay | Admitting: Physician Assistant

## 2022-06-12 DIAGNOSIS — I4819 Other persistent atrial fibrillation: Secondary | ICD-10-CM

## 2022-06-12 NOTE — Telephone Encounter (Signed)
Prescription refill request for Xarelto received.  Indication: Afib  Last office visit: 03/20/22 Lalla Brothers)  Weight: 110.3kg Age: 66 Scr: 1.07 (12/13/21)  CrCl: 107.69ml/min  Appropriate dose and refill sent to requested pharmacy.

## 2022-07-03 DIAGNOSIS — M9901 Segmental and somatic dysfunction of cervical region: Secondary | ICD-10-CM | POA: Diagnosis not present

## 2022-07-03 DIAGNOSIS — M9903 Segmental and somatic dysfunction of lumbar region: Secondary | ICD-10-CM | POA: Diagnosis not present

## 2022-07-03 DIAGNOSIS — M9902 Segmental and somatic dysfunction of thoracic region: Secondary | ICD-10-CM | POA: Diagnosis not present

## 2022-07-03 DIAGNOSIS — M531 Cervicobrachial syndrome: Secondary | ICD-10-CM | POA: Diagnosis not present

## 2022-07-03 DIAGNOSIS — M5137 Other intervertebral disc degeneration, lumbosacral region: Secondary | ICD-10-CM | POA: Diagnosis not present

## 2022-07-03 DIAGNOSIS — M5415 Radiculopathy, thoracolumbar region: Secondary | ICD-10-CM | POA: Diagnosis not present

## 2022-07-10 DIAGNOSIS — M9901 Segmental and somatic dysfunction of cervical region: Secondary | ICD-10-CM | POA: Diagnosis not present

## 2022-07-10 DIAGNOSIS — M9903 Segmental and somatic dysfunction of lumbar region: Secondary | ICD-10-CM | POA: Diagnosis not present

## 2022-07-10 DIAGNOSIS — M5137 Other intervertebral disc degeneration, lumbosacral region: Secondary | ICD-10-CM | POA: Diagnosis not present

## 2022-07-10 DIAGNOSIS — M531 Cervicobrachial syndrome: Secondary | ICD-10-CM | POA: Diagnosis not present

## 2022-07-10 DIAGNOSIS — M9902 Segmental and somatic dysfunction of thoracic region: Secondary | ICD-10-CM | POA: Diagnosis not present

## 2022-07-10 DIAGNOSIS — M5415 Radiculopathy, thoracolumbar region: Secondary | ICD-10-CM | POA: Diagnosis not present

## 2022-08-28 DIAGNOSIS — M5415 Radiculopathy, thoracolumbar region: Secondary | ICD-10-CM | POA: Diagnosis not present

## 2022-08-28 DIAGNOSIS — M9902 Segmental and somatic dysfunction of thoracic region: Secondary | ICD-10-CM | POA: Diagnosis not present

## 2022-08-28 DIAGNOSIS — M5137 Other intervertebral disc degeneration, lumbosacral region: Secondary | ICD-10-CM | POA: Diagnosis not present

## 2022-08-28 DIAGNOSIS — M9901 Segmental and somatic dysfunction of cervical region: Secondary | ICD-10-CM | POA: Diagnosis not present

## 2022-08-28 DIAGNOSIS — M9903 Segmental and somatic dysfunction of lumbar region: Secondary | ICD-10-CM | POA: Diagnosis not present

## 2022-08-28 DIAGNOSIS — M531 Cervicobrachial syndrome: Secondary | ICD-10-CM | POA: Diagnosis not present

## 2022-11-21 ENCOUNTER — Other Ambulatory Visit: Payer: Self-pay

## 2022-11-21 MED ORDER — DILTIAZEM HCL ER COATED BEADS 180 MG PO CP24: 180.0000 mg | ORAL_CAPSULE | Freq: Every day | ORAL | 0 refills | Status: DC

## 2023-01-27 ENCOUNTER — Other Ambulatory Visit (HOSPITAL_COMMUNITY): Payer: Self-pay | Admitting: Pain Medicine

## 2023-01-27 DIAGNOSIS — R1032 Left lower quadrant pain: Secondary | ICD-10-CM

## 2023-01-27 DIAGNOSIS — I480 Paroxysmal atrial fibrillation: Secondary | ICD-10-CM | POA: Diagnosis not present

## 2023-01-30 ENCOUNTER — Encounter (HOSPITAL_COMMUNITY): Payer: Self-pay

## 2023-01-30 ENCOUNTER — Ambulatory Visit (HOSPITAL_COMMUNITY)
Admission: RE | Admit: 2023-01-30 | Discharge: 2023-01-30 | Disposition: A | Discharge status: Home / Self Care | Payer: Medicare Other | Source: Ambulatory Visit | Attending: Pain Medicine | Admitting: Pain Medicine

## 2023-01-30 DIAGNOSIS — R1032 Left lower quadrant pain: Secondary | ICD-10-CM

## 2023-02-13 ENCOUNTER — Ambulatory Visit (HOSPITAL_COMMUNITY)
Admission: RE | Admit: 2023-02-13 | Discharge: 2023-02-13 | Disposition: A | Discharge status: Home / Self Care | Payer: Medicare Other | Source: Ambulatory Visit | Attending: Pain Medicine | Admitting: Pain Medicine

## 2023-02-13 DIAGNOSIS — R1032 Left lower quadrant pain: Secondary | ICD-10-CM | POA: Diagnosis not present

## 2023-02-13 DIAGNOSIS — K4090 Unilateral inguinal hernia, without obstruction or gangrene, not specified as recurrent: Secondary | ICD-10-CM | POA: Diagnosis not present

## 2023-02-13 DIAGNOSIS — K7689 Other specified diseases of liver: Secondary | ICD-10-CM | POA: Diagnosis not present

## 2023-02-13 DIAGNOSIS — K5730 Diverticulosis of large intestine without perforation or abscess without bleeding: Secondary | ICD-10-CM | POA: Diagnosis not present

## 2023-02-13 MED ORDER — IOHEXOL 350 MG/ML SOLN
75.0000 mL | Freq: Once | INTRAVENOUS | Status: AC | PRN
  Administered 2023-02-13: 75 mL via INTRAVENOUS

## 2023-02-18 ENCOUNTER — Other Ambulatory Visit: Payer: Self-pay | Admitting: Cardiology

## 2023-03-31 DIAGNOSIS — I700 Atherosclerosis of aorta: Secondary | ICD-10-CM | POA: Diagnosis not present

## 2023-03-31 DIAGNOSIS — R1904 Left lower quadrant abdominal swelling, mass and lump: Secondary | ICD-10-CM | POA: Diagnosis not present

## 2023-04-08 ENCOUNTER — Encounter: Payer: Self-pay | Admitting: Family Medicine

## 2023-04-10 ENCOUNTER — Other Ambulatory Visit (HOSPITAL_COMMUNITY): Payer: Self-pay | Admitting: Family Medicine

## 2023-04-10 DIAGNOSIS — R198 Other specified symptoms and signs involving the digestive system and abdomen: Secondary | ICD-10-CM

## 2023-04-11 ENCOUNTER — Ambulatory Visit: Payer: Medicare Other | Admitting: Student

## 2023-04-16 ENCOUNTER — Ambulatory Visit (HOSPITAL_COMMUNITY)
Admission: RE | Admit: 2023-04-16 | Discharge: 2023-04-16 | Disposition: A | Discharge status: Home / Self Care | Payer: Medicare Other | Source: Ambulatory Visit | Attending: Family Medicine | Admitting: Family Medicine

## 2023-04-16 DIAGNOSIS — R198 Other specified symptoms and signs involving the digestive system and abdomen: Secondary | ICD-10-CM | POA: Diagnosis not present

## 2023-04-16 DIAGNOSIS — R1904 Left lower quadrant abdominal swelling, mass and lump: Secondary | ICD-10-CM | POA: Diagnosis not present

## 2023-04-23 NOTE — Progress Notes (Unsigned)
Electrophysiology Office Note:   Date:  04/24/2023  ID:  Cesar Wheeler, DOB 03-17-1956, MRN [id number]  Primary Cardiologist: None Electrophysiologist: Lanier Prude, MD      History of Present Illness:   Cesar Wheeler is a 67 y.o. male with h/o persistent atrial fibrillation and atrial flutter seen today for routine electrophysiology followup.   Since last being seen in our clinic the patient reports doing OK. For the past 3 nights he has had singular palpitations after a row of normal beats. Has increased in frequency over the past few weeks. Otherwise,  he denies chest pain, dyspnea, PND, orthopnea, nausea, vomiting, dizziness, syncope, edema, weight gain, or early satiety.   Review of systems complete and found to be negative unless listed in HPI.   EP Information / Studies Reviewed:    EKG is ordered today. Personal review as below.  EKG Interpretation Date/Time:  Thursday April 24 2023 08:36:06 EDT Ventricular Rate:  69 PR Interval:  178 QRS Duration:  96 QT Interval:  418 QTC Calculation: 447 R Axis:   -26  Text Interpretation: Normal sinus rhythm Normal ECG  Noise noted in V1. Other leads consistent with sinus. Confirmed by Maxine Glenn [phone]) on 04/24/2023 8:41:24 AM    AF history Ablation 12/2021 1. There is normal pulmonary vein drainage into the left atrium.   2. 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 There is no thrombus in the left atrial appendage.   3. The esophagus runs in the left atrial midline and is not in the proximity to any of the pulmonary veins - PVI + CTI with additional ablation on posterior wall.    Echo 08/2021 - LVEF 60-65%, mild MR, mild dilation of ascending aorta 38 mm.  Cor CT 11/2021 1. There is normal pulmonary vein drainage into the left atrium. 2. 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 There is no thrombus in the left atrial appendage. 3. The esophagus runs in the left atrial midline and is not in the proximity to any of the pulmonary veins.    Physical Exam:   VS:  BP 118/76   Pulse 74   Ht 5\' 11"  (1.803 m)   Wt 229 lb 12.8 oz (104.2 kg)   SpO2 99%   BMI 32.05 kg/m    Wt Readings from Last 3 Encounters:  04/24/23 229 lb 12.8 oz (104.2 kg)  03/20/22 243 lb 3.2 oz (110.3 kg)  01/17/22 238 lb 3.2 oz (108 kg)     GEN: Well nourished, well developed in no acute distress NECK: No JVD; No carotid bruits CARDIAC: Regular rate and rhythm, no murmurs, rubs, gallops RESPIRATORY:  Clear to auscultation without rales, wheezing or rhonchi  ABDOMEN: Soft, non-tender, non-distended EXTREMITIES:  No edema; No deformity   ASSESSMENT AND PLAN:    Persistent atrial fib and flutter EKG today shows NSR as above Flecainide stopped 03/20/2022 Was instructed he could stop Xarelto 6 months post ablation with CHA2DS2VASc  of 1. Would plan to resume if he has any worsening risk factors or recurrent arrhythmia.   Obesity Body mass index is 32.05 kg/m.  Encouraged lifestyle modification   Palpitations Will wear 2 week Zio to rule out recurrent fib/flutter By history and presentation sounds most likely to be ectopy, but as he is off  Xarelto, will monitor for clarity.  Continue diltiazem 180 mg daily for now. Can increased as needed.   Follow up with EP APP in 4-6 weeks to review monitor.    Signed, Graciella Freer, PA-C

## 2023-04-24 ENCOUNTER — Ambulatory Visit (INDEPENDENT_AMBULATORY_CARE_PROVIDER_SITE_OTHER): Payer: Medicare Other

## 2023-04-24 ENCOUNTER — Ambulatory Visit: Payer: Medicare Other | Attending: Student | Admitting: Student

## 2023-04-24 ENCOUNTER — Encounter: Payer: Self-pay | Admitting: Student

## 2023-04-24 VITALS — BP 118/76 | HR 74 | Ht 71.0 in | Wt 229.8 lb

## 2023-04-24 DIAGNOSIS — R002 Palpitations: Secondary | ICD-10-CM

## 2023-04-24 DIAGNOSIS — E669 Obesity, unspecified: Secondary | ICD-10-CM | POA: Diagnosis not present

## 2023-04-24 DIAGNOSIS — I484 Atypical atrial flutter: Secondary | ICD-10-CM

## 2023-04-24 DIAGNOSIS — I480 Paroxysmal atrial fibrillation: Secondary | ICD-10-CM | POA: Diagnosis not present

## 2023-04-24 LAB — BASIC METABOLIC PANEL
BUN/Creatinine Ratio: 19 (ref 10–24)
BUN: 20 mg/dL (ref 8–27)
CO2: 23 mmol/L (ref 20–29)
Calcium: 9.5 mg/dL (ref 8.6–10.2)
Chloride: 104 mmol/L (ref 96–106)
Creatinine, Ser: 1.03 mg/dL (ref 0.76–1.27)
Glucose: 90 mg/dL (ref 70–99)
Potassium: 4.4 mmol/L (ref 3.5–5.2)
Sodium: 140 mmol/L (ref 134–144)
eGFR: 80 mL/min/{1.73_m2} (ref >59)

## 2023-04-24 NOTE — Patient Instructions (Signed)
Medication Instructions:  Your physician recommends that you continue on your current medications as directed. Please refer to the Current Medication list given to you today.  *If you need a refill on your cardiac medications before your next appointment, please call your pharmacy*  Lab Work: BMET, CBC-TODAY If you have labs (blood work) drawn today and your tests are completely normal, you will receive your results only by: MyChart Message (if you have MyChart) OR A paper copy in the mail If you have any lab test that is abnormal or we need to change your treatment, we will call you to review the results.   Testing/Procedures: Cesar Wheeler- Long Term Monitor Instructions  Your physician has requested you wear a ZIO patch monitor for 14 days.  This is a single patch monitor. [employer] supplies one patch monitor per enrollment. Additional stickers are not available. Please do not apply patch if you will be having a Nuclear Stress Test,  Echocardiogram, Cardiac CT, MRI, or Chest Xray during the period you would be wearing the  monitor. The patch cannot be worn during these tests. You cannot remove and re-apply the  ZIO XT patch monitor.  Your ZIO patch monitor will be mailed 3 day USPS to your address on file. It may take 3-5 days  to receive your monitor after you have been enrolled.  Once you have received your monitor, please review the enclosed instructions. Your monitor  has already been registered assigning a specific monitor serial # to you.  Billing and Patient Assistance Program Information  We have supplied [employer] with any of your insurance information on file for billing purposes. [employer] offers a sliding scale Patient Assistance Program for patients that do not have  insurance, or whose insurance does not completely cover the cost of the ZIO monitor.  You must apply for the Patient Assistance Program to qualify for this discounted rate.  To apply, please call [employer] at  [phone], select option 4, select option 2, ask to apply for  Patient Assistance Program. Meredeth Ide will ask your household income, and how many people  are in your household. They will quote your out-of-pocket cost based on that information.  [employer] will also be able to set up a 20-month, interest-free payment plan if needed.  Applying the monitor   Shave hair from upper left chest.  Hold abrader disc by orange tab. Rub abrader in 40 strokes over the upper left chest as  indicated in your monitor instructions.  Clean area with 4 enclosed alcohol pads. Let dry.  Apply patch as indicated in monitor instructions. Patch will be placed under collarbone on left  side of chest with arrow pointing upward.  Rub patch adhesive wings for 2 minutes. Remove white label marked "1". Remove the white  label marked "2". Rub patch adhesive wings for 2 additional minutes.  While looking in a mirror, press and release button in center of patch. A small green light will  flash 3-4 times. This will be your only indicator that the monitor has been turned on.  Do not shower for the first 24 hours. You may shower after the first 24 hours.  Press the button if you feel a symptom. You will hear a small click. Record Date, Time and  Symptom in the Patient Logbook.  When you are ready to remove the patch, follow instructions on the last 2 pages of Patient  Logbook. Stick patch monitor onto the last page of Patient Logbook.  Place Patient Logbook in  the blue and white box. Use locking tab on box and tape box closed  securely. The blue and white box has prepaid postage on it. Please place it in the mailbox as  soon as possible. Your physician should have your test results approximately 7 days after the  monitor has been mailed back to [facility].  Call [facility] Customer Care at [phone] if you have questions regarding  your ZIO XT patch monitor. Call them immediately if you see an orange light  blinking on your  monitor.  If your monitor falls off in less than 4 days, contact our Monitor department at [phone].  If your monitor becomes loose or falls off after 4 days call [employer] at [phone] for  suggestions on securing your monitor    Follow-Up: At [facility], you and your health needs are our priority.  As part of our continuing mission to provide you with exceptional heart care, we have created designated Provider Care Teams.  These Care Teams include your primary Cardiologist (physician) and Advanced Practice Providers (APPs -  Physician Assistants and Nurse Practitioners) who all work together to provide you with the care you need, when you need it.  Your next appointment:   4-6 week(s)  Provider:   Casimiro Needle "Otilio Saber, PA-C

## 2023-04-24 NOTE — Progress Notes (Unsigned)
Enrolled for [employer] to mail a ZIO XT long term holter monitor to the patients address on file.   Dr. Lalla Brothers to read.

## 2023-04-25 LAB — CBC
Hematocrit: 44 % (ref 37.5–51.0)
Hemoglobin: 14.3 g/dL (ref 13.0–17.7)
MCH: 30.6 pg (ref 26.6–33.0)
MCHC: 32.5 g/dL (ref 31.5–35.7)
MCV: 94 fL (ref 79–97)
Platelets: 222 10*3/uL (ref 150–450)
RBC: 4.68 x10E6/uL (ref 4.14–5.80)
RDW: 12 % (ref 11.6–15.4)
WBC: 5.5 10*3/uL (ref 3.4–10.8)

## 2023-04-28 DIAGNOSIS — R002 Palpitations: Secondary | ICD-10-CM | POA: Diagnosis not present

## 2023-05-19 ENCOUNTER — Other Ambulatory Visit: Payer: Self-pay | Admitting: Cardiology

## 2023-05-20 DIAGNOSIS — R002 Palpitations: Secondary | ICD-10-CM | POA: Diagnosis not present

## 2023-05-28 NOTE — Progress Notes (Unsigned)
Electrophysiology Office Note:   Date:  05/29/2023  ID:  Cesar Wheeler, DOB 09-May-1956, MRN [id number]  Primary Cardiologist: None Electrophysiologist: Lanier Prude, MD      History of Present Illness:   Cesar Wheeler is a 67 y.o. male with h/o persistent atrial fibrillation and flutter seen today for routine electrophysiology followup.   Seen 04/24/2023 and wore monitor for palpitations, close follow up made with h/o AF.   Since last being seen in our clinic the patient reports doing very well. He had mild palpitations the day he put on the monitor, but was otherwise asymptomatic. 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 Currently, he denies chest pain, dyspnea, PND, orthopnea, nausea, vomiting, dizziness, syncope, edema, weight gain, or early satiety.   Review of systems complete and found to be negative unless listed in HPI.   EP Information / Studies Reviewed:    EKG is not ordered today. EKG from 04/24/2023 reviewed which showed NSR @ 69 bpm       AF history Ablation 12/2021  1. There is normal pulmonary vein drainage into the left atrium. 2. 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 There is no thrombus in the left atrial appendage. 3. The esophagus runs in the left atrial midline and is not in the proximity to any of the pulmonary veins - PVI + CTI with additional ablation on posterior wall.    Echo 08/2021 - LVEF 60-65%, mild MR, mild dilation of ascending aorta 38 mm.   Cor CT 11/2021 1. There is normal pulmonary vein drainage into the left atrium. 2. 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 There is no thrombus in the left atrial appendage. 3. The esophagus runs in the left atrial midline and is not in the proximity to any of the  pulmonary veins.  Monitor 04/2023 HR 42 - 184, average 70. 48 SVT, longest 19 beats. Rare supraventricular and ventricular ectopy. No sustained arrhythmias. No atrial fibrillation.  Physical Exam:   VS:  BP 118/82   Pulse (!) 59   Ht 5\' 11"  (1.803 m)   Wt 225 lb 9.6 oz (102.3 kg)   SpO2 97%   BMI 31.46 kg/m    Wt Readings from Last 3 Encounters:  05/29/23 225 lb 9.6 oz (102.3 kg)  04/24/23 229 lb 12.8 oz (104.2 kg)  03/20/22 243 lb 3.2 oz (110.3 kg)     GEN: Well nourished, well developed in no acute distress NECK: No JVD; No carotid bruits CARDIAC: Regular rate and rhythm, no murmurs, rubs, gallops RESPIRATORY:  Clear to auscultation without rales, wheezing or rhonchi  ABDOMEN: Soft, non-tender, non-distended EXTREMITIES:  No edema; No deformity   ASSESSMENT AND PLAN:    Persistent atrial fib and flutter Maintaining sinus by symptom, exam, and monitor.  Flecainide stopped 03/20/2022 Was instructed he could stop Xarelto 6 months post ablation with CHA2DS2VASc  of 1. Would plan to resume if he has any worsening risk factors or recurrent arrhythmia.    Obesity Body mass index is 31.46 kg/m.  Encouraged lifestyle modification    Palpitations Very brief SVT and rare ectopy on monitor, no AF Continue diltiazem 180 mg daily for now. Can increased as  needed.   Follow up with Dr. Lalla Brothers in 12 months  Signed, Graciella Freer, PA-C

## 2023-05-29 ENCOUNTER — Encounter: Payer: Self-pay | Admitting: Student

## 2023-05-29 ENCOUNTER — Ambulatory Visit: Payer: Medicare Other | Attending: Student | Admitting: Student

## 2023-05-29 VITALS — BP 118/82 | HR 59 | Ht 71.0 in | Wt 225.6 lb

## 2023-05-29 DIAGNOSIS — I4819 Other persistent atrial fibrillation: Secondary | ICD-10-CM | POA: Diagnosis not present

## 2023-05-29 DIAGNOSIS — G4733 Obstructive sleep apnea (adult) (pediatric): Secondary | ICD-10-CM

## 2023-05-29 DIAGNOSIS — E669 Obesity, unspecified: Secondary | ICD-10-CM | POA: Diagnosis not present

## 2023-05-29 DIAGNOSIS — R002 Palpitations: Secondary | ICD-10-CM | POA: Diagnosis not present

## 2023-05-29 NOTE — Patient Instructions (Signed)

## 2023-06-09 DIAGNOSIS — R7301 Impaired fasting glucose: Secondary | ICD-10-CM | POA: Diagnosis not present

## 2023-06-09 DIAGNOSIS — Z23 Encounter for immunization: Secondary | ICD-10-CM | POA: Diagnosis not present

## 2023-06-09 DIAGNOSIS — Z0000 Encounter for general adult medical examination without abnormal findings: Secondary | ICD-10-CM | POA: Diagnosis not present

## 2023-06-09 DIAGNOSIS — R1032 Left lower quadrant pain: Secondary | ICD-10-CM | POA: Diagnosis not present

## 2023-06-09 DIAGNOSIS — I480 Paroxysmal atrial fibrillation: Secondary | ICD-10-CM | POA: Diagnosis not present

## 2023-06-09 DIAGNOSIS — E782 Mixed hyperlipidemia: Secondary | ICD-10-CM | POA: Diagnosis not present

## 2023-07-01 DIAGNOSIS — R1032 Left lower quadrant pain: Secondary | ICD-10-CM | POA: Diagnosis not present

## 2023-07-01 DIAGNOSIS — M545 Low back pain, unspecified: Secondary | ICD-10-CM | POA: Diagnosis not present

## 2023-07-04 DIAGNOSIS — M545 Low back pain, unspecified: Secondary | ICD-10-CM | POA: Diagnosis not present

## 2023-07-04 DIAGNOSIS — R1032 Left lower quadrant pain: Secondary | ICD-10-CM | POA: Diagnosis not present

## 2023-07-09 DIAGNOSIS — R1032 Left lower quadrant pain: Secondary | ICD-10-CM | POA: Diagnosis not present

## 2023-07-09 DIAGNOSIS — M545 Low back pain, unspecified: Secondary | ICD-10-CM | POA: Diagnosis not present

## 2023-11-19 ENCOUNTER — Telehealth (HOSPITAL_BASED_OUTPATIENT_CLINIC_OR_DEPARTMENT_OTHER): Payer: Self-pay | Admitting: *Deleted

## 2023-11-19 NOTE — Telephone Encounter (Signed)
   Pre-operative Risk Assessment    Patient Name: Cesar Wheeler  DOB: Nov 28, 1955 MRN: [id number]   Date of last office visit: 05/29/2023 Date of next office visit: None  Request for Surgical Clearance    Procedure:  Colonoscopy  Date of Surgery:  Clearance 12/09/23                                 Surgeon:  Dr.Robert Buccini Surgeon's Group or Practice Name:  [facility] Phone number:  [phone] Fax number:  [phone]  Type of Clearance Requested:   - Medical  - Pharmacy:  Hold Aspirin  Not indicated.   Type of Anesthesia:   Propofol    Additional requests/questions:    Signed, Lauris Port   11/19/2023, 1:06 PM \

## 2023-11-20 ENCOUNTER — Telehealth: Payer: Self-pay | Admitting: *Deleted

## 2023-11-20 NOTE — Telephone Encounter (Signed)
 Pt has been scheduled tele preop appt 11/27/23. Med rec and consent are done.

## 2023-11-20 NOTE — Telephone Encounter (Signed)
 Cardiologist in chart as Dr. Marven Slimmer.

## 2023-11-20 NOTE — Telephone Encounter (Signed)
   Name: Cesar Wheeler  DOB: 07-18-1956  MRN: [id number]  Primary Cardiologist: None Last OV: 05/29/23  Preoperative team, please contact this patient and set up a phone call appointment for further preoperative risk assessment. Please obtain consent and complete medication review. Thank you for your help.  I confirm that guidance regarding antiplatelet and oral anticoagulation therapy has been completed and, if necessary, noted below.  Regarding ASA therapy, we recommend continuation of ASA throughout the perioperative period.  However, if the surgeon feels that cessation of ASA is required in the perioperative period, it may be stopped 5-7 days prior to surgery with a plan to resume it as soon as felt to be feasible from a surgical standpoint in the post-operative period.   I also confirmed the patient resides in the state of [place] . As per [facility] Medical Board telemedicine laws, the patient must reside in the state in which the provider is licensed.  Cesar Wheeler Cesar Magaw, NP 11/20/2023, 11:19 AM [facility]

## 2023-11-20 NOTE — Telephone Encounter (Signed)
 Pt has been scheduled tele preop appt 11/27/23. Med rec and consent are done.      Patient Consent for Virtual Visit        Cesar Wheeler has provided verbal consent on 11/20/2023 for a virtual visit (video or telephone).   CONSENT FOR VIRTUAL VISIT FOR:  Cesar Wheeler  By participating in this virtual visit I agree to the following:  I hereby voluntarily request, consent and authorize [facility] and its employed or contracted physicians, physician assistants, nurse practitioners or other licensed health care professionals (the Practitioner), to provide me with telemedicine health care services (the "Services") as deemed necessary by the treating Practitioner. I acknowledge and consent to receive the Services by the Practitioner via telemedicine. I understand that the telemedicine visit will involve communicating with the Practitioner through live audiovisual communication technology and the disclosure of certain medical information by electronic transmission. I acknowledge that I have been given the opportunity to request an in-person assessment or other available alternative prior to the telemedicine visit and am voluntarily participating in the telemedicine visit.  I understand that I have the right to withhold or withdraw my consent to the use of telemedicine in the course of my care at any time, without affecting my right to future care or treatment, and that the Practitioner or I may terminate the telemedicine visit at any time. I understand that I have the right to inspect all information obtained and/or recorded in the course of the telemedicine visit and may receive copies of available information for a reasonable fee.  I understand that some of the potential risks of receiving the Services via telemedicine include:  Delay or interruption in medical evaluation due to technological equipment failure or disruption; Information transmitted may not be sufficient (e.g. poor resolution  of images) to allow for appropriate medical decision making by the Practitioner; and/or  In rare instances, security protocols could fail, causing a breach of personal health information.  Furthermore, I acknowledge that it is my responsibility to provide information about my medical history, conditions and care that is complete and accurate to the best of my ability. I acknowledge that Practitioner's advice, recommendations, and/or decision may be based on factors not within their control, such as incomplete or inaccurate data provided by me or distortions of diagnostic images or specimens that may result from electronic transmissions. I understand that the practice of medicine is not an exact science and that Practitioner makes no warranties or guarantees regarding treatment outcomes. I acknowledge that a copy of this consent can be made available to me via my patient portal [facility] MyChart), or I can request a printed copy by calling the office of [facility].    I understand that my insurance will be billed for this visit.   I have read or had this consent read to me. I understand the contents of this consent, which adequately explains the benefits and risks of the Services being provided via telemedicine.  I have been provided ample opportunity to ask questions regarding this consent and the Services and have had my questions answered to my satisfaction. I give my informed consent for the services to be provided through the use of telemedicine in my medical care

## 2023-11-24 DIAGNOSIS — L570 Actinic keratosis: Secondary | ICD-10-CM | POA: Diagnosis not present

## 2023-11-24 DIAGNOSIS — R1900 Intra-abdominal and pelvic swelling, mass and lump, unspecified site: Secondary | ICD-10-CM | POA: Diagnosis not present

## 2023-11-27 ENCOUNTER — Ambulatory Visit: Attending: Internal Medicine | Admitting: Student

## 2023-11-27 DIAGNOSIS — Z01810 Encounter for preprocedural cardiovascular examination: Secondary | ICD-10-CM | POA: Diagnosis not present

## 2023-11-27 NOTE — Progress Notes (Signed)
 Virtual Visit via Telephone Note   Because of [name] co-morbid illnesses, he is at least at moderate risk for complications without adequate follow up.  This format is felt to be most appropriate for this patient at this time.  The patient did not have access to video technology/had technical difficulties with video requiring transitioning to audio format only (telephone).  All issues noted in this document were discussed and addressed.  No physical exam could be performed with this format.  Please refer to the patient's chart for his consent to telehealth for [facility].  Evaluation Performed:  Preoperative cardiovascular risk assessment _____________   Date:  11/27/2023   Patient ID:  Cesar Wheeler, DOB 05-Sep-1955, MRN [id number] Patient Location:  Home Provider location:   Office  Primary Care Provider:  Helyn Lobstein, MD [facility] Providers Cardiologist:  None Electrophysiologist:  Boyce Byes, MD    Chief Complaint / Patient Profile   68 y.o. y/o male with a h/o PAF s/p ablation, OSA, GERD who is pending colonoscopy by Dr. Dellis Fermo and presents today for telephonic preoperative cardiovascular risk assessment.  History of Present Illness    Cesar Wheeler is a 68 y.o. male who presents via audio/video conferencing for a telehealth visit today.  Pt was last seen in cardiology clinic on 05/29/2023 by Michaelle Adolphus, PA-C.  At that time NOBERTO LIGMAN was stable from a cardiac standpoint.  The patient is now pending procedure as outlined above. Since his last visit, he is doing well. Patient denies shortness of breath, dyspnea on exertion, lower extremity edema, orthopnea or PND. No chest pain, pressure, or tightness. No palpitations.  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 He does a lot of physical work including pushing, pulling, climbing ladders, and walking without limitations.   Past Medical History    Past Medical History:  Diagnosis  Date   Abnormal blood test 02/24/2012   Atrial fibrillation (HCC)    Shingles    X3   Past Surgical History:  Procedure Laterality Date   ATRIAL FIBRILLATION ABLATION N/A 12/20/2021   Procedure: ATRIAL FIBRILLATION ABLATION;  Surgeon: Boyce Byes, MD;  Location: [facility] INVASIVE CV LAB;  Service: Cardiovascular;  Laterality: N/A;   CARDIOVERSION N/A 08/19/2018   Procedure: CARDIOVERSION;  Surgeon: Hugh Madura, MD;  Location: [facility] ENDOSCOPY;  Service: Cardiovascular;  Laterality: N/A;   FINGER FRACTURE SURGERY     KNEE ARTHROSCOPY Left    TEE WITHOUT CARDIOVERSION N/A 08/19/2018   Procedure: TRANSESOPHAGEAL ECHOCARDIOGRAM (TEE);  Surgeon: Hugh Madura, MD;  Location: [facility] ENDOSCOPY;  Service: Cardiovascular;  Laterality: N/A;   TONSILLECTOMY      Allergies  Allergies  Allergen Reactions   Penicillins Other (See Comments)    Childhood allergy, almost died   Simvastatin     Other reaction(s): AFIB    Home Medications    Prior to Admission medications   Medication Sig Start Date End Date Taking? Authorizing Provider  aspirin  EC 81 MG tablet Take 1 tablet (81 mg total) by mouth daily. Swallow whole. 06/21/22   Boyce Byes, MD  atorvastatin (LIPITOR) 10 MG tablet Take 10 mg by mouth daily. 12/25/15   [provider]  diltiazem  (CARDIZEM  CD) 180 MG 24 hr capsule TAKE 1 CAPSULE(180 MG) BY MOUTH DAILY 05/20/23   Boyce Byes, MD  fluticasone [facility]) 50 MCG/ACT nasal spray Place 1 spray into both nostrils daily as needed for allergies.  05/10/12  [provider]  Glucosamine-Chondroitin (GLUCOSAMINE CHONDR COMPLEX PO) Take 1 tablet by mouth daily.    [provider]    Physical Exam    Vital Signs:  Reinhold Carbine does not have vital signs available for review today.  Given telephonic nature of communication, physical exam is limited. AAOx3. NAD. Normal affect.  Speech and respirations are unlabored.   Assessment & Plan    [facility] Providers Cardiologist:  None Electrophysiologist:  Boyce Byes, MD {  Preoperative cardiovascular risk assessment.  Colonoscopy by Dr. Dellis Fermo on 12/09/2023.  Chart reviewed as part of pre-operative protocol coverage. According to the RCRI, patient has a 0.4% risk of MACE. Patient reports activity equivalent to >4.0 METS (works full time as an Personnel officer pushing, pulling, climbing, and walking).   Given past medical history and time since last visit, based on ACC/AHA guidelines, JAMARUS DECLARK would be at acceptable risk for the planned procedure without further cardiovascular testing.   Patient was advised that if he develops new symptoms prior to surgery to contact our office to arrange a follow-up appointment.  he verbalized understanding.  Ideally aspirin  should be continued without interruption, however if the bleeding risk is too great, aspirin  may be held for 5-7 days prior to surgery. Please resume aspirin  post operatively when it is felt to be safe from a bleeding standpoint.    I will route this recommendation to the requesting party via Epic fax function.  Please call with questions.  Time:   Today, I have spent 5  minutes with the patient with telehealth technology discussing medical history, symptoms, and management plan.     Morey Ar, NP  11/27/2023, 7:48 AM

## 2023-12-09 DIAGNOSIS — D120 Benign neoplasm of cecum: Secondary | ICD-10-CM | POA: Diagnosis not present

## 2023-12-09 DIAGNOSIS — Z1211 Encounter for screening for malignant neoplasm of colon: Secondary | ICD-10-CM | POA: Diagnosis not present

## 2023-12-09 DIAGNOSIS — D123 Benign neoplasm of transverse colon: Secondary | ICD-10-CM | POA: Diagnosis not present

## 2023-12-09 DIAGNOSIS — K5730 Diverticulosis of large intestine without perforation or abscess without bleeding: Secondary | ICD-10-CM | POA: Diagnosis not present

## 2023-12-11 DIAGNOSIS — D123 Benign neoplasm of transverse colon: Secondary | ICD-10-CM | POA: Diagnosis not present

## 2023-12-11 DIAGNOSIS — D120 Benign neoplasm of cecum: Secondary | ICD-10-CM | POA: Diagnosis not present

## 2023-12-18 DIAGNOSIS — E782 Mixed hyperlipidemia: Secondary | ICD-10-CM | POA: Diagnosis not present

## 2023-12-18 DIAGNOSIS — I480 Paroxysmal atrial fibrillation: Secondary | ICD-10-CM | POA: Diagnosis not present

## 2023-12-18 DIAGNOSIS — E038 Other specified hypothyroidism: Secondary | ICD-10-CM | POA: Diagnosis not present

## 2024-01-19 DIAGNOSIS — E782 Mixed hyperlipidemia: Secondary | ICD-10-CM | POA: Diagnosis not present

## 2024-01-19 DIAGNOSIS — I480 Paroxysmal atrial fibrillation: Secondary | ICD-10-CM | POA: Diagnosis not present

## 2024-02-19 DIAGNOSIS — I480 Paroxysmal atrial fibrillation: Secondary | ICD-10-CM | POA: Diagnosis not present

## 2024-02-19 DIAGNOSIS — E782 Mixed hyperlipidemia: Secondary | ICD-10-CM | POA: Diagnosis not present

## 2024-03-21 DIAGNOSIS — I480 Paroxysmal atrial fibrillation: Secondary | ICD-10-CM | POA: Diagnosis not present

## 2024-03-21 DIAGNOSIS — E782 Mixed hyperlipidemia: Secondary | ICD-10-CM | POA: Diagnosis not present

## 2024-04-20 DIAGNOSIS — E782 Mixed hyperlipidemia: Secondary | ICD-10-CM | POA: Diagnosis not present

## 2024-04-20 DIAGNOSIS — I480 Paroxysmal atrial fibrillation: Secondary | ICD-10-CM | POA: Diagnosis not present

## 2024-05-13 ENCOUNTER — Other Ambulatory Visit: Payer: Self-pay

## 2024-05-14 ENCOUNTER — Other Ambulatory Visit: Payer: Self-pay | Admitting: Cardiology

## 2024-05-17 MED ORDER — DILTIAZEM HCL ER COATED BEADS 180 MG PO CP24: 180.0000 mg | ORAL_CAPSULE | Freq: Every day | ORAL | 0 refills | Status: DC

## 2024-06-06 NOTE — Progress Notes (Unsigned)
  Electrophysiology Office Note:   Date:  06/07/2024  ID:  Cesar Wheeler, DOB [birth year], MRN [id number]  Primary Cardiologist: None Electrophysiologist: OLE ONEIDA HOLTS, MD   Electrophysiologist:  OLE ONEIDA HOLTS, MD      History of Present Illness:   Cesar Wheeler is a 68 y.o. male with h/o persistent atrial fibrillation and flutter  seen today for routine electrophysiology followup.   Since last being seen in our clinic the patient reports doing very well. He has had no further palpitations. Otherwise, he denies chest pain, dyspnea, PND, orthopnea, nausea, vomiting, dizziness, syncope, edema, weight gain, or early satiety.   Review of systems complete and found to be negative unless listed in HPI.   EP Information / Studies Reviewed:    EKG is ordered today. Personal review as below.  EKG Interpretation Date/Time:  Monday June 07 2024 09:23:48 EST Ventricular Rate:  68 PR Interval:  182 QRS Duration:  90 QT Interval:  414 QTC Calculation: 440 R Axis:   -24  Text Interpretation: Normal sinus rhythm Normal ECG Electrode noise Confirmed by Lesia Sharper [phone]) on 06/07/2024 9:26:48 AM    Arrhythmia/Device History No specialty comments available.   Physical Exam:   VS:  BP 125/81   Pulse 67   Ht 5' 11 (1.803 m)   Wt 224 lb (101.6 kg)   SpO2 99%   BMI 31.24 kg/m    Wt Readings from Last 3 Encounters:  06/07/24 224 lb (101.6 kg)  05/29/23 225 lb 9.6 oz (102.3 kg)  04/24/23 229 lb 12.8 oz (104.2 kg)     GEN: No acute distress NECK: No JVD; No carotid bruits CARDIAC: Regular rate and rhythm, no murmurs, rubs, gallops RESPIRATORY:  Clear to auscultation without rales, wheezing or rhonchi  ABDOMEN: Soft, non-tender, non-distended EXTREMITIES:  No edema; No deformity   ASSESSMENT AND PLAN:    Persistent AF Persistent Atrial flutter S/p ablation 12/20/2021 EKG shows NSR Flecainide  stopped post ablation CHA2DS2VASc of at least 1, OAC stopped after  ablation with no AF recurrence  Obesity Body mass index is 31.24 kg/m.  Encouraged lifestyle modification  Palpitations Brief SVT and rare ectopy on prior monitor Continue diltiazem  180 mg daily.   Pt has not had any palpitations since wearing monitor 04/2023. CHA2DS2VASc is low as above. If he develops any additional risk factors would need to consider OAC.  Otherwise, he can likely follow up with his PCP and see EP as needed only.   Follow up with EP Team in one year, or as needed.   Signed, Sharper Prentice Lesia, PA-C

## 2024-06-07 ENCOUNTER — Encounter: Payer: Self-pay | Admitting: Student

## 2024-06-07 ENCOUNTER — Ambulatory Visit: Attending: Student | Admitting: Student

## 2024-06-07 VITALS — BP 125/81 | HR 67 | Ht 71.0 in | Wt 224.0 lb

## 2024-06-07 DIAGNOSIS — I484 Atypical atrial flutter: Secondary | ICD-10-CM

## 2024-06-07 DIAGNOSIS — I4819 Other persistent atrial fibrillation: Secondary | ICD-10-CM | POA: Diagnosis not present

## 2024-06-07 DIAGNOSIS — R002 Palpitations: Secondary | ICD-10-CM

## 2024-06-07 DIAGNOSIS — E669 Obesity, unspecified: Secondary | ICD-10-CM

## 2024-06-07 DIAGNOSIS — G4733 Obstructive sleep apnea (adult) (pediatric): Secondary | ICD-10-CM | POA: Diagnosis not present

## 2024-06-07 NOTE — Patient Instructions (Signed)
 Medication Instructions:  No medication changes today. *If you need a refill on your cardiac medications before your next appointment, please call your pharmacy*  Lab Work: No labwork ordered today. If you have labs (blood work) drawn today and your tests are completely normal, you will receive your results only by: MyChart Message (if you have MyChart) OR A paper copy in the mail If you have any lab test that is abnormal or we need to change your treatment, we will call you to review the results.  Testing/Procedures: No testing ordered today  Follow-Up: At [facility], you and your health needs are our priority.  As part of our continuing mission to provide you with exceptional heart care, our providers are all part of one team.  This team includes your primary Cardiologist (physician) and Advanced Practice Providers or APPs (Physician Assistants and Nurse Practitioners) who all work together to provide you with the care you need, when you need it.  Your next appointment:   12 month(s)  Provider:   You may see Boyce Byes, MD or one of the following Advanced Practice Providers on your designated Care Team:   Mertha Abrahams, [place] Bambi Lever "Jonelle Neri" Willard, PA-C Suzann Riddle, NP Creighton Doffing, NP    We recommend signing up for the patient portal called "MyChart".  Sign up information is provided on this After Visit Summary.  MyChart is used to connect with patients for Virtual Visits (Telemedicine).  Patients are able to view lab/test results, encounter notes, upcoming appointments, etc.  Non-urgent messages can be sent to your provider as well.   To learn more about what you can do with MyChart, go to [email or web address].

## 2024-08-13 ENCOUNTER — Other Ambulatory Visit: Payer: Self-pay

## 2024-08-17 MED ORDER — DILTIAZEM HCL ER COATED BEADS 180 MG PO CP24: 180.0000 mg | ORAL_CAPSULE | Freq: Every day | ORAL | 2 refills | Status: AC
